# Patient Record
Sex: Female | Born: 1950
Health system: Southern US, Community
[De-identification: ages and names within clinical notes are randomized; demographics above are authoritative.]

## PROBLEM LIST (undated history)

## (undated) DIAGNOSIS — K219 Gastro-esophageal reflux disease without esophagitis: Secondary | ICD-10-CM

## (undated) DIAGNOSIS — I10 Essential (primary) hypertension: Secondary | ICD-10-CM

## (undated) DIAGNOSIS — E782 Mixed hyperlipidemia: Secondary | ICD-10-CM

## (undated) HISTORY — DX: Essential (primary) hypertension: I10

## (undated) HISTORY — DX: Gastro-esophageal reflux disease without esophagitis: K21.9

## (undated) HISTORY — PX: ABDOMINAL HYSTERECTOMY: SHX81

## (undated) HISTORY — DX: Mixed hyperlipidemia: E78.2

---

## 1997-12-11 ENCOUNTER — Other Ambulatory Visit: Admission: RE | Admit: 1997-12-11 | Discharge: 1997-12-11 | Payer: Self-pay | Admitting: *Deleted

## 1999-01-08 ENCOUNTER — Other Ambulatory Visit: Admission: RE | Admit: 1999-01-08 | Discharge: 1999-01-08 | Payer: Self-pay | Admitting: *Deleted

## 2000-02-11 ENCOUNTER — Other Ambulatory Visit: Admission: RE | Admit: 2000-02-11 | Discharge: 2000-02-11 | Payer: Self-pay | Admitting: *Deleted

## 2001-02-24 ENCOUNTER — Other Ambulatory Visit: Admission: RE | Admit: 2001-02-24 | Discharge: 2001-02-24 | Payer: Self-pay | Admitting: *Deleted

## 2002-02-27 ENCOUNTER — Other Ambulatory Visit: Admission: RE | Admit: 2002-02-27 | Discharge: 2002-02-27 | Payer: Self-pay | Admitting: *Deleted

## 2003-03-08 ENCOUNTER — Other Ambulatory Visit: Admission: RE | Admit: 2003-03-08 | Discharge: 2003-03-08 | Payer: Self-pay | Admitting: *Deleted

## 2004-04-03 ENCOUNTER — Other Ambulatory Visit: Admission: RE | Admit: 2004-04-03 | Discharge: 2004-04-03 | Payer: Self-pay | Admitting: *Deleted

## 2005-03-23 ENCOUNTER — Other Ambulatory Visit: Admission: RE | Admit: 2005-03-23 | Discharge: 2005-03-23 | Payer: Self-pay | Admitting: *Deleted

## 2006-06-02 ENCOUNTER — Other Ambulatory Visit: Admission: RE | Admit: 2006-06-02 | Discharge: 2006-06-02 | Payer: Self-pay | Admitting: *Deleted

## 2007-06-09 ENCOUNTER — Other Ambulatory Visit: Admission: RE | Admit: 2007-06-09 | Discharge: 2007-06-09 | Payer: Self-pay | Admitting: *Deleted

## 2013-03-15 ENCOUNTER — Other Ambulatory Visit: Payer: Self-pay | Admitting: Obstetrics and Gynecology

## 2016-12-04 LAB — HM COLONOSCOPY

## 2017-08-19 ENCOUNTER — Ambulatory Visit: Payer: BC Managed Care – PPO | Admitting: Sports Medicine

## 2017-08-19 ENCOUNTER — Encounter: Payer: Self-pay | Admitting: Sports Medicine

## 2017-08-19 VITALS — BP 120/70 | Ht 64.0 in | Wt 142.0 lb

## 2017-08-19 DIAGNOSIS — M19072 Primary osteoarthritis, left ankle and foot: Secondary | ICD-10-CM | POA: Diagnosis not present

## 2017-08-20 ENCOUNTER — Encounter: Payer: Self-pay | Admitting: Sports Medicine

## 2017-08-20 NOTE — Progress Notes (Signed)
   Subjective:    Patient ID: Melinda Miller, female    DOB: July 02, 1950, 67 y.o.   MRN: [id number]  HPI chief complaint: Left foot pain  Very pleasant 67 year old female is here today at the request of Dr. Madelon Lipsaffrey for possible orthotics. She saw Dr.Caffrey's PA on April 2 and was diagnosed with some mild arthritis in the mid foot. She was given a prescription for meloxicam to take as needed and referred to our office for possible inserts. She localizes the pain to the arch of the foot. It does radiate dorsally as well.She has not noticed any swelling. Her symptoms have been present for a couple of months. No trauma. She denies numbness or tingling. [facility] mild midfoot DJD. Patient has not had orthotics in the past. She denies pain in the right foot.  Past medical history reviewed Medications reviewed Allergies reviewed    Review of Systems    as above Objective:   Physical Exam  Well-developed, well-nourished. No acute distress.Awake alert and oriented 3. Vital signs reviewed  Left foot: Slight cavus foot. No soft tissue swelling. Slight tenderness to palpation along the arch of the foot but no tenderness to palpation at the calcaneal origin of the plantar fascia. No pain with calcaneal squeeze.Slight tenderness to palpation across the mid foot dorsal area. Good pulses. Walking without a limp.      Assessment & Plan:   Left foot pain likely secondary to mild midfoot DJD  Custom orthotics were created for the patient today. She found them to be comfortable prior to leaving the office. Total of 30 minutes was spent with the patient with greater than 50% of the time spent in face-to-face consultation discussing orthotic construction, instruction, and fitting. Follow-up as needed.  Patient was fitted for a : standard, cushioned, semi-rigid orthotic. The orthotic was heated and afterward the patient stood on the orthotic blank  positioned on the orthotic stand. The patient was positioned in subtalar neutral position and 10 degrees of ankle dorsiflexion in a weight bearing stance. After completion of molding, a stable base was applied to the orthotic blank. The blank was ground to a stable position for weight bearing. Size: 7 Base: Blue EVA Posting: none Additional orthotic padding: none

## 2019-06-10 ENCOUNTER — Ambulatory Visit: Payer: Medicare PPO | Attending: Internal Medicine

## 2019-06-10 DIAGNOSIS — Z23 Encounter for immunization: Secondary | ICD-10-CM | POA: Insufficient documentation

## 2019-06-10 NOTE — Progress Notes (Signed)
   Covid-19 Vaccination Clinic  Name:  QUINN BARTLING    MRN: [id number] DOB: Nov 18, 1950  06/10/2019  Ms. Claire was observed post Covid-19 immunization for 15 minutes without incidence. She was provided with Vaccine Information Sheet and instruction to access the V-Safe system.   Ms. Taira was instructed to call 911 with any severe reactions post vaccine: Marland Kitchen Difficulty breathing  . Swelling of your face and throat  . A fast heartbeat  . A bad rash all over your body  . Dizziness and weakness    Immunizations Administered    Name Date Dose VIS Date Route   Pfizer COVID-19 Vaccine 06/10/2019 12:22 PM 0.3 mL 04/28/2019 Intramuscular   Manufacturer: [employer], [employer]   Lot: [id number]   NDC: [address]-9

## 2019-06-16 DIAGNOSIS — R197 Diarrhea, unspecified: Secondary | ICD-10-CM | POA: Diagnosis not present

## 2019-06-16 DIAGNOSIS — U071 COVID-19: Secondary | ICD-10-CM | POA: Diagnosis not present

## 2019-06-16 DIAGNOSIS — J301 Allergic rhinitis due to pollen: Secondary | ICD-10-CM | POA: Diagnosis not present

## 2019-06-16 DIAGNOSIS — Z20828 Contact with and (suspected) exposure to other viral communicable diseases: Secondary | ICD-10-CM | POA: Diagnosis not present

## 2019-06-16 DIAGNOSIS — Z23 Encounter for immunization: Secondary | ICD-10-CM | POA: Diagnosis not present

## 2019-06-22 DIAGNOSIS — I10 Essential (primary) hypertension: Secondary | ICD-10-CM | POA: Diagnosis not present

## 2019-06-22 DIAGNOSIS — U071 COVID-19: Secondary | ICD-10-CM | POA: Diagnosis not present

## 2019-06-22 DIAGNOSIS — R031 Nonspecific low blood-pressure reading: Secondary | ICD-10-CM | POA: Diagnosis not present

## 2019-06-22 DIAGNOSIS — E785 Hyperlipidemia, unspecified: Secondary | ICD-10-CM | POA: Diagnosis not present

## 2019-06-22 DIAGNOSIS — R002 Palpitations: Secondary | ICD-10-CM | POA: Diagnosis not present

## 2019-06-22 DIAGNOSIS — I493 Ventricular premature depolarization: Secondary | ICD-10-CM | POA: Diagnosis not present

## 2019-07-01 ENCOUNTER — Ambulatory Visit: Payer: Medicare PPO | Attending: Internal Medicine

## 2019-07-01 DIAGNOSIS — Z23 Encounter for immunization: Secondary | ICD-10-CM | POA: Insufficient documentation

## 2019-07-01 NOTE — Progress Notes (Signed)
   Covid-19 Vaccination Clinic  Name:  Melinda Miller    MRN: [id number] DOB: 05/10/1951  07/01/2019  [name] was observed post Covid-19 immunization for 15 minutes without incidence. She was provided with Vaccine Information Sheet and instruction to access the V-Safe system.   [name] was instructed to call 911 with any severe reactions post vaccine: Marland Kitchen Difficulty breathing  . Swelling of your face and throat  . A fast heartbeat  . A bad rash all over your body  . Dizziness and weakness    Immunizations Administered    Name Date Dose VIS Date Route   Pfizer COVID-19 Vaccine 07/01/2019 10:59 AM 0.3 mL 04/28/2019 Intramuscular   Manufacturer: [employer], [employer]   Lot: [id number]   NDC: [id number]

## 2019-08-10 DIAGNOSIS — Z1231 Encounter for screening mammogram for malignant neoplasm of breast: Secondary | ICD-10-CM | POA: Diagnosis not present

## 2019-08-10 LAB — HM MAMMOGRAPHY: HM Mammogram: NORMAL (ref 0–4)

## 2019-10-18 NOTE — Progress Notes (Signed)
New Patient Office Visit  Subjective:  Patient ID: Melinda Miller, female    DOB: 1951/04/27  Age: 69 y.o. MRN: [id number]  CC:  Chief Complaint  Patient presents with  . Establish Care    Will schedule appt with GYN for CPE and will have her set up appt for DEXA. Patient has not had any Pneumo vaccines.  . Hypertension    Feels that her current b/p medication may not be working as well.   . Hyperlipidemia  . Gastroesophageal Reflux    Complains she still has belching    HPI Melinda Miller presents to establish care.  Patient was referred by Hassan Rowan and Vivien Rossetti and Lenore Cordia, other patients of mine.  Patient previously saw Dr. Nicki Reaper who has retired and she was placed with a new physician at [facility].  Patient has hypertension, hyperlipidemia, acid reflux and some arthritis.  Patient has no complaints today.  Hypertension: This was diagnosed more than 10 years ago.  Patient is on telmisartan 40 mg once daily.  Patient eats healthy and exercises.  Hyperlipidemia: This was diagnosed more than 10 years ago.  Patient is on Crestor 5 mg once daily.  Patient eats healthy and exercises.  GERD: Patient's reflux disease is currently being treated with omeprazole. Pt has breakthrough reflux.   Osteoarthritis of hands: Currently on meloxicam 15 mg once daily which does help.  Allergic rhinitis: Patient is taking Nasonex.         Patient has psoriasis on the plantar surface of her feet.  She uses Diprolene for this which works well.                 Past Medical History:  Diagnosis Date  . Essential hypertension   . GERD (gastroesophageal reflux disease)   . Mixed hyperlipidemia     Past Surgical History:  Procedure Laterality Date  . ABDOMINAL HYSTERECTOMY      Family History  Problem Relation Age of Onset  . Alzheimer's disease Mother   . CAD Father   . Heart attack Father   . Cancer Sister        kidney  . Alzheimer's disease Sister     Social  History   Socioeconomic History  . Marital status: Married    Spouse name: Ahrianna Siglin  . Number of children: 2  . Years of education: Not on file  . Highest education level: Not on file  Occupational History  . Not on file  Tobacco Use  . Smoking status: Never Smoker  . Smokeless tobacco: Never Used  Substance and Sexual Activity  . Alcohol use: Yes    Alcohol/week: 1.0 standard drink    Types: 1 Cans of beer per week  . Drug use: Never  . Sexual activity: Not on file  Other Topics Concern  . Not on file  Social History Narrative  . Not on file   Social Determinants of Health   Financial Resource Strain:   . Difficulty of Paying Living Expenses:   Food Insecurity:   . Worried About Charity fundraiser in the Last Year:   . Arboriculturist in the Last Year:   Transportation Needs:   . Film/video editor (Medical):   Marland Kitchen Lack of Transportation (Non-Medical):   Physical Activity:   . Days of Exercise per Week:   . Minutes of Exercise per Session:   Stress:   . Feeling of Stress :   Social  Connections:   . Frequency of Communication with Friends and Family:   . Frequency of Social Gatherings with Friends and Family:   . Attends Religious Services:   . Active Member of Clubs or Organizations:   . Attends Banker Meetings:   Marland Kitchen Marital Status:   Intimate Partner Violence:   . Fear of Current or Ex-Partner:   . Emotionally Abused:   Marland Kitchen Physically Abused:   . Sexually Abused:     ROS Review of Systems  Constitutional: Negative for chills, fatigue and fever.  HENT: Positive for hearing loss (has hearing aids). Negative for congestion, ear pain, rhinorrhea and sore throat.   Respiratory: Negative for cough and shortness of breath.   Cardiovascular: Negative for chest pain.  Gastrointestinal: Negative for abdominal pain, constipation, diarrhea, nausea and vomiting.  Genitourinary: Negative for dysuria and urgency.  Musculoskeletal: Negative for  arthralgias, back pain and myalgias.  Neurological: Negative for dizziness, weakness, light-headedness and headaches.  Psychiatric/Behavioral: Negative for dysphoric mood. The patient is not nervous/anxious.     Objective:   Today's Vitals: BP 136/64   Pulse 82   Temp (!) 97.3 F (36.3 C)   Ht 5\' 4"  (1.626 m)   Wt 148 lb 12.8 oz (67.5 kg)   SpO2 92%   BMI 25.54 kg/m   Physical Exam Vitals reviewed.  Constitutional:      Appearance: Normal appearance. She is normal weight.  Cardiovascular:     Rate and Rhythm: Normal rate and regular rhythm.     Pulses: Normal pulses.     Heart sounds: Normal heart sounds.  Pulmonary:     Effort: Pulmonary effort is normal. No respiratory distress.     Breath sounds: Normal breath sounds.  Abdominal:     General: Abdomen is flat. Bowel sounds are normal.     Palpations: Abdomen is soft.     Tenderness: There is no abdominal tenderness.  Musculoskeletal:        General: Deformity (heberdens and bouchards nodules, but also some deviation of fingers. Question whether some of nodules might be uric acid tophi.) present.  Neurological:     Mental Status: She is alert and oriented to person, place, and time.  Psychiatric:        Mood and Affect: Mood normal.        Behavior: Behavior normal.     Assessment & Plan:   Problem List Items Addressed This Visit      Cardiovascular and Mediastinum   Essential hypertension, benign - Primary Well controlled.  Change telmasartan to 40mg  once at bedtime instead of am dosing. Continue to work on eating a healthy diet and exercise.  Labs drawn today.    Relevant Medications      telmisartan (MICARDIS) 40 MG tablet   Other Relevant Orders   CBC with Differential/Platelet (Completed)   Comprehensive metabolic panel (Completed)   TSH (Completed)     Respiratory   Non-seasonal allergic rhinitis due to pollen - continue nasonex.     Digestive   GERD without esophagitis Change prilosec to  protonix.   Relevant Medications   pantoprazole (PROTONIX) 40 MG tablet     Other   Mixed hyperlipidemia Well controlled.  No changes to medicines.  Continue to work on eating a healthy diet and exercise.  Labs drawn today.    Relevant Medications   rosuvastatin (CRESTOR) 5 MG tablet      Other Relevant Orders   Lipid panel (Completed)   Pain in  both hands  Continue meloxicam. Check uric acid level.   Relevant Orders   Uric Acid (Completed)   Need for prophylactic vaccination with Streptococcus pneumoniae (Pneumococcus) and Influenza vaccines   Relevant Orders   Pneumococcal polysaccharide vaccine 23-valent greater than or equal to 2yo subcutaneous/IM (Completed)      Outpatient Encounter Medications as of 10/19/2019  Medication Sig  . augmented betamethasone dipropionate (DIPROLENE-AF) 0.05 % cream Apply topically 2 (two) times daily.  . Calcium Carbonate-Vit D-Min (CALCIUM 1200 PO) Take 1,200 mg by mouth in the morning and at bedtime.  . meloxicam (MOBIC) 15 MG tablet Take 1 tablet (15 mg total) by mouth daily.  . mometasone (NASONEX) 50 MCG/ACT nasal spray Place 2 sprays into the nose daily.  . rosuvastatin (CRESTOR) 5 MG tablet Take 5 mg by mouth daily.  Marland Kitchen telmisartan (MICARDIS) 40 MG tablet Take 1 tablet (40 mg total) by mouth daily.  . [DISCONTINUED] augmented betamethasone dipropionate (DIPROLENE-AF) 0.05 % cream   . [DISCONTINUED] meloxicam (MOBIC) 15 MG tablet Take 15 mg by mouth daily.  . [DISCONTINUED] mometasone (NASONEX) 50 MCG/ACT nasal spray   . [DISCONTINUED] omeprazole (PRILOSEC) 20 MG capsule Take 20 mg by mouth at bedtime.  . [DISCONTINUED] telmisartan (MICARDIS) 40 MG tablet Take 40 mg by mouth daily.  . pantoprazole (PROTONIX) 40 MG tablet Take 1 tablet (40 mg total) by mouth daily.   No facility-administered encounter medications on file as of 10/19/2019.    Follow-up: Return in about 3 months (around 01/23/2020) for Annual Wellness Visit with Creola Corn,  LPN.   Blane Ohara, MD

## 2019-10-19 ENCOUNTER — Encounter: Payer: Self-pay | Admitting: Family Medicine

## 2019-10-19 ENCOUNTER — Other Ambulatory Visit: Payer: Self-pay

## 2019-10-19 ENCOUNTER — Ambulatory Visit: Payer: Medicare PPO | Admitting: Family Medicine

## 2019-10-19 VITALS — BP 136/64 | HR 82 | Temp 97.3°F | Ht 64.0 in | Wt 148.8 lb

## 2019-10-19 DIAGNOSIS — I10 Essential (primary) hypertension: Secondary | ICD-10-CM | POA: Diagnosis not present

## 2019-10-19 DIAGNOSIS — M79641 Pain in right hand: Secondary | ICD-10-CM | POA: Insufficient documentation

## 2019-10-19 DIAGNOSIS — E782 Mixed hyperlipidemia: Secondary | ICD-10-CM

## 2019-10-19 DIAGNOSIS — K219 Gastro-esophageal reflux disease without esophagitis: Secondary | ICD-10-CM | POA: Diagnosis not present

## 2019-10-19 DIAGNOSIS — J301 Allergic rhinitis due to pollen: Secondary | ICD-10-CM

## 2019-10-19 DIAGNOSIS — Z23 Encounter for immunization: Secondary | ICD-10-CM | POA: Insufficient documentation

## 2019-10-19 DIAGNOSIS — M79642 Pain in left hand: Secondary | ICD-10-CM | POA: Diagnosis not present

## 2019-10-19 MED ORDER — PANTOPRAZOLE SODIUM 40 MG PO TBEC: 40.0000 mg | DELAYED_RELEASE_TABLET | Freq: Every day | ORAL | 3 refills | Status: DC

## 2019-10-19 MED ORDER — MOMETASONE FUROATE 50 MCG/ACT NA SUSP: 2.0000 | Freq: Every day | NASAL | 5 refills | Status: DC

## 2019-10-19 MED ORDER — MELOXICAM 15 MG PO TABS: 15.0000 mg | ORAL_TABLET | Freq: Every day | ORAL | 3 refills | Status: DC

## 2019-10-19 MED ORDER — TELMISARTAN 40 MG PO TABS: 40.0000 mg | ORAL_TABLET | Freq: Every day | ORAL | 3 refills | Status: DC

## 2019-10-19 MED ORDER — BETAMETHASONE DIPROPIONATE AUG 0.05 % EX CREA: TOPICAL_CREAM | Freq: Two times a day (BID) | CUTANEOUS | 1 refills | Status: DC

## 2019-10-19 NOTE — Patient Instructions (Addendum)
Change telmasartan to 40 mg before bedtime.  Change prilosec to protonix (pantoprazole) 40 mg once daily.  Recommend Annual Wellness Exam with Creola Corn, LPN in our office in the fall.

## 2019-10-20 LAB — CBC WITH DIFFERENTIAL/PLATELET
Basophils Absolute: 0 10*3/uL (ref 0.0–0.2)
Basos: 1 %
EOS (ABSOLUTE): 0.1 10*3/uL (ref 0.0–0.4)
Eos: 1 %
Hematocrit: 39.6 % (ref 34.0–46.6)
Hemoglobin: 13.2 g/dL (ref 11.1–15.9)
Immature Grans (Abs): 0 10*3/uL (ref 0.0–0.1)
Immature Granulocytes: 0 %
Lymphocytes Absolute: 3.3 10*3/uL — ABNORMAL HIGH (ref 0.7–3.1)
Lymphs: 41 %
MCH: 31.4 pg (ref 26.6–33.0)
MCHC: 33.3 g/dL (ref 31.5–35.7)
MCV: 94 fL (ref 79–97)
Monocytes Absolute: 0.9 10*3/uL (ref 0.1–0.9)
Monocytes: 11 %
Neutrophils Absolute: 3.6 10*3/uL (ref 1.4–7.0)
Neutrophils: 46 %
Platelets: 219 10*3/uL (ref 150–450)
RBC: 4.21 x10E6/uL (ref 3.77–5.28)
RDW: 12.9 % (ref 11.7–15.4)
WBC: 7.9 10*3/uL (ref 3.4–10.8)

## 2019-10-20 LAB — LIPID PANEL
Chol/HDL Ratio: 3.2 ratio (ref 0.0–4.4)
Cholesterol, Total: 187 mg/dL (ref 100–199)
HDL: 59 mg/dL (ref >39)
LDL Chol Calc (NIH): 111 mg/dL — ABNORMAL HIGH (ref 0–99)
Triglycerides: 91 mg/dL (ref 0–149)
VLDL Cholesterol Cal: 17 mg/dL (ref 5–40)

## 2019-10-20 LAB — CARDIOVASCULAR RISK ASSESSMENT

## 2019-10-20 LAB — COMPREHENSIVE METABOLIC PANEL
ALT: 15 IU/L (ref 0–32)
AST: 19 IU/L (ref 0–40)
Albumin/Globulin Ratio: 1.7 (ref 1.2–2.2)
Albumin: 4.7 g/dL (ref 3.8–4.8)
Alkaline Phosphatase: 96 IU/L (ref 48–121)
BUN/Creatinine Ratio: 22 (ref 12–28)
BUN: 20 mg/dL (ref 8–27)
Bilirubin Total: 0.5 mg/dL (ref 0.0–1.2)
CO2: 25 mmol/L (ref 20–29)
Calcium: 10 mg/dL (ref 8.7–10.3)
Chloride: 100 mmol/L (ref 96–106)
Creatinine, Ser: 0.89 mg/dL (ref 0.57–1.00)
GFR calc Af Amer: 78 mL/min/{1.73_m2} (ref >59)
GFR calc non Af Amer: 68 mL/min/{1.73_m2} (ref >59)
Globulin, Total: 2.7 g/dL (ref 1.5–4.5)
Glucose: 91 mg/dL (ref 65–99)
Potassium: 5.1 mmol/L (ref 3.5–5.2)
Sodium: 139 mmol/L (ref 134–144)
Total Protein: 7.4 g/dL (ref 6.0–8.5)

## 2019-10-20 LAB — TSH: TSH: 1.99 u[IU]/mL (ref 0.450–4.500)

## 2019-10-20 LAB — URIC ACID: Uric Acid: 6.7 mg/dL (ref 3.0–7.2)

## 2019-10-28 ENCOUNTER — Encounter: Payer: Self-pay | Admitting: Family Medicine

## 2019-10-28 DIAGNOSIS — J301 Allergic rhinitis due to pollen: Secondary | ICD-10-CM | POA: Insufficient documentation

## 2019-11-01 ENCOUNTER — Other Ambulatory Visit: Payer: Self-pay

## 2019-11-01 MED ORDER — ROSUVASTATIN CALCIUM 5 MG PO TABS: 5.0000 mg | ORAL_TABLET | Freq: Every day | ORAL | 0 refills | Status: DC

## 2020-01-23 ENCOUNTER — Ambulatory Visit: Payer: Medicare PPO

## 2020-02-02 ENCOUNTER — Other Ambulatory Visit: Payer: Self-pay | Admitting: Family Medicine

## 2020-05-03 ENCOUNTER — Other Ambulatory Visit: Payer: Self-pay | Admitting: Family Medicine

## 2020-08-08 ENCOUNTER — Other Ambulatory Visit: Payer: Self-pay | Admitting: Family Medicine

## 2020-08-13 ENCOUNTER — Other Ambulatory Visit: Payer: Self-pay

## 2020-08-13 MED ORDER — ROSUVASTATIN CALCIUM 5 MG PO TABS: ORAL_TABLET | ORAL | 0 refills | Status: DC

## 2020-08-16 ENCOUNTER — Ambulatory Visit: Payer: Medicare PPO | Admitting: Family Medicine

## 2020-08-16 ENCOUNTER — Encounter: Payer: Self-pay | Admitting: Family Medicine

## 2020-08-16 ENCOUNTER — Other Ambulatory Visit: Payer: Self-pay

## 2020-08-16 VITALS — BP 132/84 | HR 76 | Temp 96.5°F | Ht 64.0 in | Wt 153.0 lb

## 2020-08-16 DIAGNOSIS — N3000 Acute cystitis without hematuria: Secondary | ICD-10-CM | POA: Diagnosis not present

## 2020-08-16 DIAGNOSIS — B373 Candidiasis of vulva and vagina: Secondary | ICD-10-CM | POA: Diagnosis not present

## 2020-08-16 DIAGNOSIS — R6889 Other general symptoms and signs: Secondary | ICD-10-CM | POA: Insufficient documentation

## 2020-08-16 DIAGNOSIS — R002 Palpitations: Secondary | ICD-10-CM | POA: Insufficient documentation

## 2020-08-16 DIAGNOSIS — I10 Essential (primary) hypertension: Secondary | ICD-10-CM | POA: Insufficient documentation

## 2020-08-16 DIAGNOSIS — U071 COVID-19: Secondary | ICD-10-CM | POA: Insufficient documentation

## 2020-08-16 DIAGNOSIS — B3731 Acute candidiasis of vulva and vagina: Secondary | ICD-10-CM

## 2020-08-16 DIAGNOSIS — I493 Ventricular premature depolarization: Secondary | ICD-10-CM | POA: Insufficient documentation

## 2020-08-16 LAB — POCT URINALYSIS DIPSTICK
Bilirubin, UA: NEGATIVE
Blood, UA: NEGATIVE
Glucose, UA: NEGATIVE
Ketones, UA: NEGATIVE
Protein, UA: NEGATIVE
Spec Grav, UA: 1.02 (ref 1.010–1.025)
Urobilinogen, UA: 0.2 E.U./dL
pH, UA: 5.5 (ref 5.0–8.0)

## 2020-08-16 MED ORDER — NITROFURANTOIN MONOHYD MACRO 100 MG PO CAPS: 100.0000 mg | ORAL_CAPSULE | Freq: Two times a day (BID) | ORAL | 0 refills | Status: DC

## 2020-08-16 MED ORDER — FLUCONAZOLE 150 MG PO TABS: 150.0000 mg | ORAL_TABLET | Freq: Once | ORAL | 0 refills | Status: AC

## 2020-08-16 NOTE — Progress Notes (Signed)
Subjective:  Patient ID: Melinda Miller, female    DOB: [birth year]  Age: 70 y.o. MRN: [id number]  Chief Complaint  Patient presents with  . Vaginal Itching  . Vaginal Pain    HPI Pt is concerned she has a yeast infection. Tried otc monistat x 3 days which did not help.  Itchy. Burns. No polyuria.   Current Outpatient Medications on File Prior to Visit  Medication Sig Dispense Refill  . augmented betamethasone dipropionate (DIPROLENE-AF) 0.05 % cream Apply topically 2 (two) times daily. 50 g 1  . Calcium Carbonate-Vit D-Min (CALCIUM 1200 PO) Take 1,200 mg by mouth in the morning and at bedtime.    . meloxicam (MOBIC) 15 MG tablet Take 1 tablet (15 mg total) by mouth daily. 90 tablet 3  . mometasone (NASONEX) 50 MCG/ACT nasal spray Place 2 sprays into the nose daily. 17 g 5  . pantoprazole (PROTONIX) 40 MG tablet Take 1 tablet (40 mg total) by mouth daily. 90 tablet 3  . rosuvastatin (CRESTOR) 5 MG tablet TAKE ONE (1) TABLET ONCE DAILY 90 tablet 0  . telmisartan (MICARDIS) 40 MG tablet Take 1 tablet (40 mg total) by mouth daily. 90 tablet 3   No current facility-administered medications on file prior to visit.   Past Medical History:  Diagnosis Date  . Essential hypertension   . GERD (gastroesophageal reflux disease)   . Mixed hyperlipidemia    Past Surgical History:  Procedure Laterality Date  . ABDOMINAL HYSTERECTOMY      Family History  Problem Relation Age of Onset  . Alzheimer's disease Mother   . CAD Father   . Heart attack Father   . Cancer Sister        kidney  . Alzheimer's disease Sister    Social History   Socioeconomic History  . Marital status: Married    Spouse name: Royal Beirne  . Number of children: 2  . Years of education: Not on file  . Highest education level: Not on file  Occupational History  . Not on file  Tobacco Use  . Smoking status: Never Smoker  . Smokeless tobacco: Never Used  Substance and Sexual Activity  . Alcohol use: Yes     Alcohol/week: 1.0 standard drink    Types: 1 Cans of beer per week  . Drug use: Never  . Sexual activity: Not on file  Other Topics Concern  . Not on file  Social History Narrative  . Not on file   Social Determinants of Health   Financial Resource Strain: Not on file  Food Insecurity: Not on file  Transportation Needs: Not on file  Physical Activity: Not on file  Stress: Not on file  Social Connections: Not on file    Review of Systems  Constitutional: Negative for chills, fatigue and fever.  HENT: Negative for congestion, ear pain and sore throat.   Respiratory: Negative for cough and shortness of breath.   Cardiovascular: Negative for chest pain.  Gastrointestinal: Negative for abdominal pain, nausea and vomiting.  Genitourinary: Positive for dysuria and vaginal discharge. Negative for hematuria and urgency.       Vaginal itching     Objective:  BP 132/84 (BP Location: Right Arm, Patient Position: Sitting, Cuff Size: Normal)   Pulse 76   Temp (!) 96.5 F (35.8 C) (Temporal)   Ht 5\' 4"  (1.626 m)   Wt 153 lb (69.4 kg)   SpO2 99%   BMI 26.26 kg/m   BP/Weight 08/16/2020 10/19/2019  08/19/2017  Systolic BP 132 136 120  Diastolic BP 84 64 70  Wt. (Lbs) 153 148.8 142  BMI 26.26 25.54 24.37    Physical Exam Vitals reviewed.  Constitutional:      Appearance: Normal appearance. She is normal weight.  Cardiovascular:     Rate and Rhythm: Normal rate and regular rhythm.     Heart sounds: Normal heart sounds.  Pulmonary:     Effort: Pulmonary effort is normal. No respiratory distress.     Breath sounds: Normal breath sounds.  Abdominal:     General: Abdomen is flat. Bowel sounds are normal.     Palpations: Abdomen is soft.     Tenderness: There is no abdominal tenderness. There is no right CVA tenderness or left CVA tenderness.  Neurological:     Mental Status: She is alert and oriented to person, place, and time.  Psychiatric:        Mood and Affect: Mood normal.         Behavior: Behavior normal.     Diabetic Foot Exam - Simple   No data filed      Lab Results  Component Value Date   WBC 7.9 10/19/2019   HGB 13.2 10/19/2019   HCT 39.6 10/19/2019   PLT 219 10/19/2019   GLUCOSE 91 10/19/2019   CHOL 187 10/19/2019   TRIG 91 10/19/2019   HDL 59 10/19/2019   LDLCALC 111 (H) 10/19/2019   ALT 15 10/19/2019   AST 19 10/19/2019   NA 139 10/19/2019   K 5.1 10/19/2019   CL 100 10/19/2019   CREATININE 0.89 10/19/2019   BUN 20 10/19/2019   CO2 25 10/19/2019   TSH 1.990 10/19/2019      Assessment & Plan:   1. Acute cystitis without hematuria - POCT urinalysis dipstick - nitrofurantoin, macrocrystal-monohydrate, (MACROBID) 100 MG capsule; Take 1 capsule (100 mg total) by mouth 2 (two) times daily.  Dispense: 14 capsule; Refill: 0 - Urine Culture  2. Candidal vaginitis - fluconazole (DIFLUCAN) 150 MG tablet; Take 1 tablet (150 mg total) by mouth once for 1 dose.  Dispense: 2 tablet; Refill: 0  Pt had to leave to pick up her granddaughter and I was running late.  Treat empirically for yeast infection. Return next week if not improving.   Meds ordered this encounter  Medications  . nitrofurantoin, macrocrystal-monohydrate, (MACROBID) 100 MG capsule    Sig: Take 1 capsule (100 mg total) by mouth 2 (two) times daily.    Dispense:  14 capsule    Refill:  0  . fluconazole (DIFLUCAN) 150 MG tablet    Sig: Take 1 tablet (150 mg total) by mouth once for 1 dose.    Dispense:  2 tablet    Refill:  0    Orders Placed This Encounter  Procedures  . Urine Culture  . POCT urinalysis dipstick        Follow-up: Return in about 3 months (around 11/13/2020).  An After Visit Summary was printed and given to the patient.  Blane Ohara, MD [facility] [phone]

## 2020-08-18 LAB — URINE CULTURE

## 2020-08-26 ENCOUNTER — Other Ambulatory Visit: Payer: Self-pay

## 2020-08-30 DIAGNOSIS — Z1231 Encounter for screening mammogram for malignant neoplasm of breast: Secondary | ICD-10-CM | POA: Diagnosis not present

## 2020-08-30 LAB — HM MAMMOGRAPHY

## 2020-09-10 ENCOUNTER — Other Ambulatory Visit: Payer: Self-pay

## 2020-09-10 ENCOUNTER — Ambulatory Visit: Payer: Medicare PPO | Admitting: Family Medicine

## 2020-09-10 VITALS — BP 140/70 | HR 80 | Temp 97.1°F | Resp 16 | Ht 64.0 in | Wt 151.0 lb

## 2020-09-10 DIAGNOSIS — S80862A Insect bite (nonvenomous), left lower leg, initial encounter: Secondary | ICD-10-CM

## 2020-09-10 DIAGNOSIS — S80861A Insect bite (nonvenomous), right lower leg, initial encounter: Secondary | ICD-10-CM | POA: Diagnosis not present

## 2020-09-10 DIAGNOSIS — S30861A Insect bite (nonvenomous) of abdominal wall, initial encounter: Secondary | ICD-10-CM

## 2020-09-10 DIAGNOSIS — W57XXXA Bitten or stung by nonvenomous insect and other nonvenomous arthropods, initial encounter: Secondary | ICD-10-CM | POA: Diagnosis not present

## 2020-09-10 DIAGNOSIS — S61231A Puncture wound without foreign body of left index finger without damage to nail, initial encounter: Secondary | ICD-10-CM

## 2020-09-10 MED ORDER — DOXYCYCLINE HYCLATE 100 MG PO TABS
100.0000 mg | ORAL_TABLET | Freq: Two times a day (BID) | ORAL | 0 refills | Status: DC
Start: 2020-09-10 — End: 2020-09-17

## 2020-09-10 NOTE — Progress Notes (Signed)
Acute Office Visit  Subjective:    Patient ID: Melinda Miller, female    DOB: 1950/10/15, 70 y.o.   MRN: [id number]  Chief Complaint  Patient presents with  . Insect Bite    HPI Patient is in today for insect bites.  [place] and had bite to her right abdomen.  She noticed it in the middle of the night Sunday night when it was itching.  Denies any pain.  They were fishing.  In addition she has noticed [place] that she has had small red marks on her leg that resemble bites.  They do not itch nor are they painful.  She denies fevers, chills, sweats.  Denies any significant headache.  She has a slight headache Sunday morning but resolved.  No other family members have any bites. Patient is clean the right abdominal insect bite with peroxide daily.  In addition she had a fishhook going to her left index finger on Saturday.  Denies pain or drainage from the site.  Last Tdap was 2014.  Past Medical History:  Diagnosis Date  . Essential hypertension   . GERD (gastroesophageal reflux disease)   . Mixed hyperlipidemia     Past Surgical History:  Procedure Laterality Date  . ABDOMINAL HYSTERECTOMY      Family History  Problem Relation Age of Onset  . Alzheimer's disease Mother   . CAD Father   . Heart attack Father   . Cancer Sister        kidney  . Alzheimer's disease Sister     Social History   Socioeconomic History  . Marital status: Married    Spouse name: Analy Bassford  . Number of children: 2  . Years of education: Not on file  . Highest education level: Not on file  Occupational History  . Not on file  Tobacco Use  . Smoking status: Never Smoker  . Smokeless tobacco: Never Used  Substance and Sexual Activity  . Alcohol use: Yes    Alcohol/week: 1.0 standard drink    Types: 1 Cans of beer per week  . Drug use: Never  . Sexual activity: Not on file  Other Topics Concern  . Not on file   Social History Narrative  . Not on file   Social Determinants of Health   Financial Resource Strain: Not on file  Food Insecurity: Not on file  Transportation Needs: Not on file  Physical Activity: Not on file  Stress: Not on file  Social Connections: Not on file  Intimate Partner Violence: Not on file    Outpatient Medications Prior to Visit  Medication Sig Dispense Refill  . augmented betamethasone dipropionate (DIPROLENE-AF) 0.05 % cream Apply topically 2 (two) times daily. 50 g 1  . Calcium Carbonate-Vit D-Min (CALCIUM 1200 PO) Take 1,200 mg by mouth in the morning and at bedtime.    . meloxicam (MOBIC) 15 MG tablet Take 1 tablet (15 mg total) by mouth daily. 90 tablet 3  . mometasone (NASONEX) 50 MCG/ACT nasal spray Place 2 sprays into the nose daily. 17 g 5  . pantoprazole (PROTONIX) 40 MG tablet Take 1 tablet (40 mg total) by mouth daily. 90 tablet 3  . rosuvastatin (CRESTOR) 5 MG tablet TAKE ONE (1) TABLET ONCE DAILY 90 tablet 0  . telmisartan (MICARDIS) 40 MG tablet Take 1 tablet (40 mg total) by mouth daily. 90 tablet 3  . nitrofurantoin,  macrocrystal-monohydrate, (MACROBID) 100 MG capsule Take 1 capsule (100 mg total) by mouth 2 (two) times daily. 14 capsule 0   No facility-administered medications prior to visit.    Allergies  Allergen Reactions  . Sulfa Antibiotics Hives    Review of Systems  Constitutional: Negative for chills, fatigue and fever.  Neurological: Negative for headaches.       Objective:    Physical Exam Vitals reviewed.  Constitutional:      Appearance: Normal appearance.  Skin:    Findings: Lesion (Insect bite right upper abdomen.  Has a erythema surrounding that.  Total lesion is approximately the size of a quarter.  Patient has small macules over her lower and upper legs approximately 15-20 total.  They are not grouped in patches.  She has no rashe) present.     Comments: Puncture wound of left index finger no drainage.  No erythema.   Nontender   Neurological:     Mental Status: She is alert.     BP 140/70   Pulse 80   Temp (!) 97.1 F (36.2 C)   Resp 16   Ht 5\' 4"  (1.626 m)   Wt 151 lb (68.5 kg)   BMI 25.92 kg/m  Wt Readings from Last 3 Encounters:  09/10/20 151 lb (68.5 kg)  08/16/20 153 lb (69.4 kg)  10/19/19 148 lb 12.8 oz (67.5 kg)    Health Maintenance Due  Topic Date Due  . Hepatitis C Screening  Never done  . TETANUS/TDAP  Never done  . COLONOSCOPY (Pts 45-89yrs Insurance coverage will need to be confirmed)  Never done  . DEXA SCAN  Never done  . COVID-19 Vaccine (3 - Booster for Pfizer series) 12/29/2019    There are no preventive care reminders to display for this patient.   Lab Results  Component Value Date   TSH 1.990 10/19/2019   Lab Results  Component Value Date   WBC 7.9 10/19/2019   HGB 13.2 10/19/2019   HCT 39.6 10/19/2019   MCV 94 10/19/2019   PLT 219 10/19/2019   Lab Results  Component Value Date   NA 139 10/19/2019   K 5.1 10/19/2019   CO2 25 10/19/2019   GLUCOSE 91 10/19/2019   BUN 20 10/19/2019   CREATININE 0.89 10/19/2019   BILITOT 0.5 10/19/2019   ALKPHOS 96 10/19/2019   AST 19 10/19/2019   ALT 15 10/19/2019   PROT 7.4 10/19/2019   ALBUMIN 4.7 10/19/2019   CALCIUM 10.0 10/19/2019   Lab Results  Component Value Date   CHOL 187 10/19/2019   Lab Results  Component Value Date   HDL 59 10/19/2019   Lab Results  Component Value Date   LDLCALC 111 (H) 10/19/2019   Lab Results  Component Value Date   TRIG 91 10/19/2019   Lab Results  Component Value Date   CHOLHDL 3.2 10/19/2019   No results found for: HGBA1C     Assessment & Plan:  1. Insect bite of abdominal wall, initial encounter Doxycycline 100 mg 2 daily x1 to prophylax against possible tick fever. 2. Insect bite of left lower extremity, initial encounter 3. Insect bite of right lower extremity, initial encounter 4. Puncture wound of left index finger   Reassurance given.  No obvious  source.  They have no pets.  She has not been in garden.  [place] the bites on her legs occurred after she returned.  Patient to call back if worsening. Meds ordered this  encounter  Medications  . doxycycline (VIBRA-TABS) 100 MG tablet    Sig: Take 1 tablet (100 mg total) by mouth 2 (two) times daily.    Dispense:  2 tablet    Refill:  0    Follow-up: No follow-ups on file.  An After Visit Summary was printed and given to the patient.  Blane Ohara, MD [facility] [phone]

## 2020-09-17 ENCOUNTER — Ambulatory Visit (INDEPENDENT_AMBULATORY_CARE_PROVIDER_SITE_OTHER): Payer: Medicare PPO

## 2020-09-17 ENCOUNTER — Other Ambulatory Visit: Payer: Self-pay

## 2020-09-17 VITALS — BP 132/70 | HR 68 | Temp 97.8°F | Resp 16 | Ht 64.0 in | Wt 152.0 lb

## 2020-09-17 DIAGNOSIS — K219 Gastro-esophageal reflux disease without esophagitis: Secondary | ICD-10-CM

## 2020-09-17 DIAGNOSIS — Z0000 Encounter for general adult medical examination without abnormal findings: Secondary | ICD-10-CM

## 2020-09-17 DIAGNOSIS — Z6826 Body mass index (BMI) 26.0-26.9, adult: Secondary | ICD-10-CM

## 2020-09-17 DIAGNOSIS — E782 Mixed hyperlipidemia: Secondary | ICD-10-CM

## 2020-09-17 MED ORDER — PANTOPRAZOLE SODIUM 40 MG PO TBEC: 40.0000 mg | DELAYED_RELEASE_TABLET | Freq: Every day | ORAL | 3 refills | Status: DC

## 2020-09-17 MED ORDER — ROSUVASTATIN CALCIUM 5 MG PO TABS: ORAL_TABLET | ORAL | 0 refills | Status: DC

## 2020-09-17 NOTE — Patient Instructions (Signed)
 Fall Prevention in the Home, Adult Falls can cause injuries and can happen to people of all ages. There are many things you can do to make your home safe and to help prevent falls. Ask for help when making these changes. What actions can I take to prevent falls? General Instructions  Use good lighting in all rooms. Replace any light bulbs that burn out.  Turn on the lights in dark areas. Use night-lights.  Keep items that you use often in easy-to-reach places. Lower the shelves around your home if needed.  Set up your furniture so you have a clear path. Avoid moving your furniture around.  Do not have throw rugs or other things on the floor that can make you trip.  Avoid walking on wet floors.  If any of your floors are uneven, fix them.  Add color or contrast paint or tape to clearly mark and help you see: ? Grab bars or handrails. ? First and last steps of staircases. ? Where the edge of each step is.  If you use a stepladder: ? Make sure that it is fully opened. Do not climb a closed stepladder. ? Make sure the sides of the stepladder are locked in place. ? Ask someone to hold the stepladder while you use it.  Know where your pets are when moving through your home. What can I do in the bathroom?  Keep the floor dry. Clean up any water on the floor right away.  Remove soap buildup in the tub or shower.  Use nonskid mats or decals on the floor of the tub or shower.  Attach bath mats securely with double-sided, nonslip rug tape.  If you need to sit down in the shower, use a plastic, nonslip stool.  Install grab bars by the toilet and in the tub and shower. Do not use towel bars as grab bars.      What can I do in the bedroom?  Make sure that you have a light by your bed that is easy to reach.  Do not use any sheets or blankets for your bed that hang to the floor.  Have a firm chair with side arms that you can use for support when you get dressed. What can I do  in the kitchen?  Clean up any spills right away.  If you need to reach something above you, use a step stool with a grab bar.  Keep electrical cords out of the way.  Do not use floor polish or wax that makes floors slippery. What can I do with my stairs?  Do not leave any items on the stairs.  Make sure that you have a light switch at the top and the bottom of the stairs.  Make sure that there are handrails on both sides of the stairs. Fix handrails that are broken or loose.  Install nonslip stair treads on all your stairs.  Avoid having throw rugs at the top or bottom of the stairs.  Choose a carpet that does not hide the edge of the steps on the stairs.  Check carpeting to make sure that it is firmly attached to the stairs. Fix carpet that is loose or worn. What can I do on the outside of my home?  Use bright outdoor lighting.  Fix the edges of walkways and driveways and fix any cracks.  Remove anything that might make you trip as you walk through a door, such as a raised step or threshold.  Trim   any bushes or trees on paths to your home.  Check to see if handrails are loose or broken and that both sides of all steps have handrails.  Install guardrails along the edges of any raised decks and porches.  Clear paths of anything that can make you trip, such as tools or rocks.  Have leaves, snow, or ice cleared regularly.  Use sand or salt on paths during winter.  Clean up any spills in your garage right away. This includes grease or oil spills. What other actions can I take?  Wear shoes that: ? Have a low heel. Do not wear high heels. ? Have rubber bottoms. ? Feel good on your feet and fit well. ? Are closed at the toe. Do not wear open-toe sandals.  Use tools that help you move around if needed. These include: ? Canes. ? Walkers. ? Scooters. ? Crutches.  Review your medicines with your doctor. Some medicines can make you feel dizzy. This can increase your  chance of falling. Ask your doctor what else you can do to help prevent falls. Where to find more information  Centers for Disease Control and Prevention, [employer]: www.cdc.gov  National Institute on Aging: www.nia.nih.gov Contact a doctor if:  You are afraid of falling at home.  You feel weak, drowsy, or dizzy at home.  You fall at home. Summary  There are many simple things that you can do to make your home safe and to help prevent falls.  Ways to make your home safe include removing things that can make you trip and installing grab bars in the bathroom.  Ask for help when making these changes in your home. This information is not intended to replace advice given to you by your health care provider. Make sure you discuss any questions you have with your health care provider. Document Revised: 12/06/2019 Document Reviewed: 12/06/2019 Elsevier Patient Education  2021 Elsevier Inc.   Health Maintenance, Female Adopting a healthy lifestyle and getting preventive care are important in promoting health and wellness. Ask your health care provider about:  The right schedule for you to have regular tests and exams.  Things you can do on your own to prevent diseases and keep yourself healthy. What should I know about diet, weight, and exercise? Eat a healthy diet  Eat a diet that includes plenty of vegetables, fruits, low-fat dairy products, and lean protein.  Do not eat a lot of foods that are high in solid fats, added sugars, or sodium.   Maintain a healthy weight Body mass index (BMI) is used to identify weight problems. It estimates body fat based on height and weight. Your health care provider can help determine your BMI and help you achieve or maintain a healthy weight. Get regular exercise Get regular exercise. This is one of the most important things you can do for your health. Most adults should:  Exercise for at least 150 minutes each week. The exercise should increase  your heart rate and make you sweat (moderate-intensity exercise).  Do strengthening exercises at least twice a week. This is in addition to the moderate-intensity exercise.  Spend less time sitting. Even light physical activity can be beneficial. Watch cholesterol and blood lipids Have your blood tested for lipids and cholesterol at 68 years of age, then have this test every 5 years. Have your cholesterol levels checked more often if:  Your lipid or cholesterol levels are high.  You are older than 68 years of age.  You are at   high risk for heart disease. What should I know about cancer screening? Depending on your health history and family history, you may need to have cancer screening at various ages. This may include screening for:  Breast cancer.  Cervical cancer.  Colorectal cancer.  Skin cancer.  Lung cancer. What should I know about heart disease, diabetes, and high blood pressure? Blood pressure and heart disease  High blood pressure causes heart disease and increases the risk of stroke. This is more likely to develop in people who have high blood pressure readings, are of African descent, or are overweight.  Have your blood pressure checked: ? Every 3-5 years if you are 18-39 years of age. ? Every year if you are 40 years old or older. Diabetes Have regular diabetes screenings. This checks your fasting blood sugar level. Have the screening done:  Once every three years after age 40 if you are at a normal weight and have a low risk for diabetes.  More often and at a younger age if you are overweight or have a high risk for diabetes. What should I know about preventing infection? Hepatitis B If you have a higher risk for hepatitis B, you should be screened for this virus. Talk with your health care provider to find out if you are at risk for hepatitis B infection. Hepatitis C Testing is recommended for:  Everyone born from 1945 through 1965.  Anyone with known  risk factors for hepatitis C. Sexually transmitted infections (STIs)  Get screened for STIs, including gonorrhea and chlamydia, if: ? You are sexually active and are younger than 68 years of age. ? You are older than 68 years of age and your health care provider tells you that you are at risk for this type of infection. ? Your sexual activity has changed since you were last screened, and you are at increased risk for chlamydia or gonorrhea. Ask your health care provider if you are at risk.  Ask your health care provider about whether you are at high risk for HIV. Your health care provider may recommend a prescription medicine to help prevent HIV infection. If you choose to take medicine to prevent HIV, you should first get tested for HIV. You should then be tested every 3 months for as long as you are taking the medicine. Pregnancy  If you are about to stop having your period (premenopausal) and you may become pregnant, seek counseling before you get pregnant.  Take 400 to 800 micrograms (mcg) of folic acid every day if you become pregnant.  Ask for birth control (contraception) if you want to prevent pregnancy. Osteoporosis and menopause Osteoporosis is a disease in which the bones lose minerals and strength with aging. This can result in bone fractures. If you are 65 years old or older, or if you are at risk for osteoporosis and fractures, ask your health care provider if you should:  Be screened for bone loss.  Take a calcium or vitamin D supplement to lower your risk of fractures.  Be given hormone replacement therapy (HRT) to treat symptoms of menopause. Follow these instructions at home: Lifestyle  Do not use any products that contain nicotine or tobacco, such as cigarettes, e-cigarettes, and chewing tobacco. If you need help quitting, ask your health care provider.  Do not use street drugs.  Do not share needles.  Ask your health care provider for help if you need support or  information about quitting drugs. Alcohol use  Do not drink alcohol   if: ? Your health care provider tells you not to drink. ? You are pregnant, may be pregnant, or are planning to become pregnant.  If you drink alcohol: ? Limit how much you use to 0-1 drink a day. ? Limit intake if you are breastfeeding.  Be aware of how much alcohol is in your drink. In the [place] one drink equals one 12 oz bottle of beer (355 mL), one 5 oz glass of wine (148 mL), or one 1 oz glass of hard liquor (44 mL). General instructions  Schedule regular health, dental, and eye exams.  Stay current with your vaccines.  Tell your health care provider if: ? You often feel depressed. ? You have ever been abused or do not feel safe at home. Summary  Adopting a healthy lifestyle and getting preventive care are important in promoting health and wellness.  Follow your health care provider's instructions about healthy diet, exercising, and getting tested or screened for diseases.  Follow your health care provider's instructions on monitoring your cholesterol and blood pressure. This information is not intended to replace advice given to you by your health care provider. Make sure you discuss any questions you have with your health care provider. Document Revised: 04/27/2018 Document Reviewed: 04/27/2018 Elsevier Patient Education  2021 Elsevier Inc.  

## 2020-09-17 NOTE — Progress Notes (Signed)
Subjective:   Melinda Miller is a 70 y.o. female who presents for Medicare Annual (Subsequent) preventive examination.  This wellness visit is conducted by a nurse.  The patient's medications were reviewed and reconciled since the patient's last visit.  History details were provided by the patient.  The history appears to be reliable.    Patient's last AWV was unknown.   Medical History: Patient history and Family history was reviewed  Medications, Allergies, and preventative health maintenance was reviewed and updated.   Review of Systems    Review of Systems  Constitutional: Negative.   HENT: Positive for hearing loss.   Eyes: Negative.   Respiratory: Negative for cough, chest tightness and shortness of breath.   Cardiovascular: Negative.  Negative for chest pain and palpitations.  Gastrointestinal: Negative.   Genitourinary: Negative.   Musculoskeletal: Negative.   Skin:       Insect bite on abdomen  Neurological: Negative.  Negative for dizziness, numbness and headaches.  Psychiatric/Behavioral: Negative.  Negative for confusion, dysphoric mood and suicidal ideas. The patient is not nervous/anxious.    Cardiac Risk Factors include: advanced age (>26men, >30 women);dyslipidemia;hypertension     Objective:    Today's Vitals   09/17/20 0854  BP: 132/70  Pulse: 68  Resp: 16  Temp: 97.8 F (36.6 C)  SpO2: 97%  Weight: 152 lb (68.9 kg)  Height: 5\' 4"  (1.626 m)  PainSc: 0-No pain   Body mass index is 26.09 kg/m.  Advanced Directives 09/17/2020  Does Patient Have a Medical Advance Directive? Yes  Type of Advance Directive Living will  Does patient want to make changes to medical advance directive? No - Patient declined    Current Medications (verified) Outpatient Encounter Medications as of 09/17/2020  Medication Sig  . augmented betamethasone dipropionate (DIPROLENE-AF) 0.05 % cream Apply topically 2 (two) times daily.  . Calcium Carbonate-Vit D-Min (CALCIUM 1200  PO) Take 1,200 mg by mouth in the morning and at bedtime.  . fluconazole (DIFLUCAN) 150 MG tablet Take by mouth.  . meloxicam (MOBIC) 15 MG tablet Take 1 tablet (15 mg total) by mouth daily.  . mometasone (NASONEX) 50 MCG/ACT nasal spray Place 2 sprays into the nose daily.  . pantoprazole (PROTONIX) 40 MG tablet Take 1 tablet (40 mg total) by mouth daily.  . rosuvastatin (CRESTOR) 5 MG tablet TAKE ONE (1) TABLET ONCE DAILY  . telmisartan (MICARDIS) 40 MG tablet Take 1 tablet (40 mg total) by mouth daily.  . [DISCONTINUED] doxycycline (VIBRA-TABS) 100 MG tablet Take 1 tablet (100 mg total) by mouth 2 (two) times daily.   No facility-administered encounter medications on file as of 09/17/2020.   Allergies (verified) Sulfa antibiotics   History: Past Medical History:  Diagnosis Date  . Essential hypertension   . GERD (gastroesophageal reflux disease)   . Mixed hyperlipidemia    Past Surgical History:  Procedure Laterality Date  . ABDOMINAL HYSTERECTOMY     Family History  Problem Relation Age of Onset  . Alzheimer's disease Mother   . CAD Father   . Heart attack Father   . Cancer Sister        kidney  . Alzheimer's disease Sister    Social History   Socioeconomic History  . Marital status: Married    Spouse name: Herberta Pickron  . Number of children: 2  . Years of education: Not on file  . Highest education level: Not on file  Occupational History  . Not on file  Tobacco  Use  . Smoking status: Never Smoker  . Smokeless tobacco: Never Used  Substance and Sexual Activity  . Alcohol use: Yes    Alcohol/week: 1.0 standard drink    Types: 1 Cans of beer per week  . Drug use: Never  . Sexual activity: Not on file  Other Topics Concern  . Not on file  Social History Narrative  . Not on file   Social Determinants of Health   Financial Resource Strain: Not on file  Food Insecurity: No Food Insecurity  . Worried About Programme researcher, broadcasting/film/video in the Last Year: Never true  .  Ran Out of Food in the Last Year: Never true  Transportation Needs: No Transportation Needs  . Lack of Transportation (Medical): No  . Lack of Transportation (Non-Medical): No  Physical Activity: Insufficiently Active  . Days of Exercise per Week: 4 days  . Minutes of Exercise per Session: 30 min  Stress: Not on file  Social Connections: Not on file   Tobacco Counseling Counseling given: Never Smoker  Clinical Intake:  Pre-visit preparation completed: Yes  Pain : No/denies pain Pain Score: 0-No pain   BMI - recorded: 26.09 Nutritional Status: BMI 25 -29 Overweight Nutritional Risks: None Diabetes: No How often do you need to have someone help you when you read instructions, pamphlets, or other written materials from your doctor or pharmacy?: 2 - Rarely  Interpreter Needed?: No   Activities of Daily Living In your present state of health, do you have any difficulty performing the following activities: 09/17/2020 10/19/2019  Hearing? Y Y  Comment hearing aids and wax build-up hearing aids  Vision? N N  Difficulty concentrating or making decisions? N N  Walking or climbing stairs? N N  Dressing or bathing? N N  Doing errands, shopping? N N  Preparing Food and eating ? N -  Using the Toilet? N -  In the past six months, have you accidently leaked urine? N -  Do you have problems with loss of bowel control? N -  Managing your Medications? N -  Managing your Finances? N -  Housekeeping or managing your Housekeeping? N -  Some recent data might be hidden    Patient Care Team: Blane Ohara, MD as PCP - General (Family Medicine)     Assessment:   This is a routine wellness examination for Melinda Miller.  Dietary issues and exercise activities discussed: Current Exercise Habits: Home exercise routine, Type of exercise: walking, Time (Minutes): 30, Frequency (Times/Week): 4, Weekly Exercise (Minutes/Week): 120, Intensity: Mild, Exercise limited by: None identified  Depression  Screen PHQ 2/9 Scores 09/17/2020 10/19/2019  PHQ - 2 Score 0 0    Fall Risk Fall Risk  09/17/2020 10/19/2019  Falls in the past year? 0 0  Number falls in past yr: 0 0  Injury with Fall? 0 0  Risk for fall due to : No Fall Risks No Fall Risks  Follow up Falls evaluation completed;Falls prevention discussed Falls prevention discussed   Cognitive Function:     6CIT Screen 09/17/2020  What Year? 0 points  What month? 0 points  What time? 0 points  Count back from 20 0 points  Months in reverse 0 points  Repeat phrase 0 points  Total Score 0    Immunizations Immunization History  Administered Date(s) Administered  . PFIZER(Purple Top)SARS-COV-2 Vaccination 06/10/2019, 07/01/2019, 02/12/2020  . Pneumococcal Polysaccharide-23 10/19/2019  . Tdap 02/17/2011    TDAP status: Up to date  Flu Vaccine status:  Up to date  Pneumococcal vaccine status: Up to date  Covid-19 vaccine status: Completed vaccines  Qualifies for Shingles Vaccine? Yes   Zostavax completed No   Shingrix Completed?: No.    Education has been provided regarding the importance of this vaccine. Patient has been advised to call insurance company to determine out of pocket expense if they have not yet received this vaccine. Advised may also receive vaccine at local pharmacy or Health Dept. Verbalized acceptance and understanding.  Screening Tests Health Maintenance  Topic Date Due  . Hepatitis C Screening  Never done  . COLONOSCOPY (Pts 45-10yrs Insurance coverage will need to be confirmed)  Never done  . DEXA SCAN  Never done  . MAMMOGRAM  08/09/2020  . PNA vac Low Risk Adult (2 of 2 - PCV13) 10/18/2020  . INFLUENZA VACCINE  12/16/2020  . TETANUS/TDAP  02/16/2021  . COVID-19 Vaccine  Completed  . HPV VACCINES  Aged Out    Health Maintenance  Health Maintenance Due  Topic Date Due  . Hepatitis C Screening  Never done  . COLONOSCOPY (Pts 45-53yrs Insurance coverage will need to be confirmed)  Never done  .  DEXA SCAN  Never done  . MAMMOGRAM  08/09/2020    Colorectal cancer screening: Type of screening: Colonoscopy. Completed at [facility], records requested  Mammogram status: Completed this year at [facility], records requested  Bone Density status: Completed at [facility], records requested  Lung Cancer Screening: (Low Dose CT Chest recommended if Age 44-80 years, 30 pack-year currently smoking OR have quit w/in 15years.) does not qualify.   Additional Screening:  Vision Screening: Recommended annual ophthalmology exams for early detection of glaucoma and other disorders of the eye. Is the patient up to date with their annual eye exam?  Yes   Dental Screening: Recommended annual dental exams for proper oral hygiene    Plan:     1- Results requested for mammogram, DEXA, and Colon Cancer Screening 2- Immunizations: PNA vaccine due after 10/18/20, 4th dose of COVID Vaccine due (patient said she will wait a little while longer) 3- Cerumen Impaction - I was able to retrieve a moderate amount of sticky wax from both ears, I was able to visualize the eardrum in the right ear however not the left.  She will use debrox drops at home and return in three days. 4- Insect bite on right side abdomen - no signs of infection, recommended clean with soap and water daily and apply cortisone cream for itching.  I have personally reviewed and noted the following in the patient's chart:   . Medical and social history . Use of alcohol, tobacco or illicit drugs  . Current medications and supplements including opioid prescriptions.  . Functional ability and status . Nutritional status . Physical activity . Advanced directives . List of other physicians . Hospitalizations, surgeries, and ER visits in previous 12 months . Vitals . Screenings to include cognitive, depression, and falls . Referrals and appointments  In addition, I have reviewed and discussed with patient certain preventive protocols,  quality metrics, and best practice recommendations. A written personalized care plan for preventive services as well as general preventive health recommendations were provided to patient.     Jacklynn Bue, LPN   [id number]

## 2020-09-28 ENCOUNTER — Encounter: Payer: Self-pay | Admitting: Family Medicine

## 2020-11-13 ENCOUNTER — Other Ambulatory Visit: Payer: Self-pay

## 2020-11-13 ENCOUNTER — Ambulatory Visit: Payer: Medicare PPO | Admitting: Family Medicine

## 2020-11-13 VITALS — BP 128/72 | HR 71 | Temp 96.0°F | Ht 64.0 in | Wt 149.0 lb

## 2020-11-13 DIAGNOSIS — E782 Mixed hyperlipidemia: Secondary | ICD-10-CM

## 2020-11-13 DIAGNOSIS — I10 Essential (primary) hypertension: Secondary | ICD-10-CM

## 2020-11-13 DIAGNOSIS — R10811 Right upper quadrant abdominal tenderness: Secondary | ICD-10-CM

## 2020-11-13 DIAGNOSIS — Z23 Encounter for immunization: Secondary | ICD-10-CM

## 2020-11-13 DIAGNOSIS — K219 Gastro-esophageal reflux disease without esophagitis: Secondary | ICD-10-CM | POA: Diagnosis not present

## 2020-11-13 MED ORDER — ROSUVASTATIN CALCIUM 5 MG PO TABS: ORAL_TABLET | ORAL | 1 refills | Status: DC

## 2020-11-13 MED ORDER — TELMISARTAN 40 MG PO TABS: 40.0000 mg | ORAL_TABLET | Freq: Every day | ORAL | 1 refills | Status: DC

## 2020-11-13 MED ORDER — FAMOTIDINE 40 MG PO TABS: 40.0000 mg | ORAL_TABLET | Freq: Every day | ORAL | 1 refills | Status: DC

## 2020-11-13 MED ORDER — MELOXICAM 15 MG PO TABS: 15.0000 mg | ORAL_TABLET | Freq: Every day | ORAL | 3 refills | Status: DC

## 2020-11-13 NOTE — Progress Notes (Signed)
Subjective:  Patient ID: Melinda Miller, female    DOB: [birth year]  Age: 70 y.o. MRN: [id number]  Chief Complaint  Patient presents with   Hyperlipidemia   Hypertension     HPI Cholesterol- takes crestor 5 mg daily, maintains a healthy diet HTN-takes micardis 40 mg daily GERD-controlled with protonix 40 mg daily C/o RUQ abdominal pain/tenderness sometimes especially after eating [place] food, pizza or burgers. Has had nausea once                                                                                                         Current Outpatient Medications on File Prior to Visit  Medication Sig Dispense Refill   augmented betamethasone dipropionate (DIPROLENE-AF) 0.05 % cream Apply topically 2 (two) times daily. 50 g 1   Calcium Carbonate-Vit D-Min (CALCIUM 1200 PO) Take 1,200 mg by mouth in the morning and at bedtime.     mometasone (NASONEX) 50 MCG/ACT nasal spray Place 2 sprays into the nose daily. 17 g 5   pantoprazole (PROTONIX) 40 MG tablet Take 1 tablet (40 mg total) by mouth daily. 90 tablet 3   No current facility-administered medications on file prior to visit.   Past Medical History:  Diagnosis Date   Essential hypertension    GERD (gastroesophageal reflux disease)    Mixed hyperlipidemia    Past Surgical History:  Procedure Laterality Date   ABDOMINAL HYSTERECTOMY      Family History  Problem Relation Age of Onset   Alzheimer's disease Mother    CAD Father    Heart attack Father    Cancer Sister        kidney   Alzheimer's disease Sister    Social History   Socioeconomic History   Marital status: Married    Spouse name: Mckenley Birenbaum   Number of children: 2   Years of education: Not on file   Highest education level: Not on file  Occupational History   Not on file  Tobacco Use   Smoking status: Never   Smokeless tobacco: Never  Substance and Sexual Activity   Alcohol use: Yes    Alcohol/week: 1.0 standard drink    Types: 1 Cans of  beer per week   Drug use: Never   Sexual activity: Not on file  Other Topics Concern   Not on file  Social History Narrative   Not on file   Social Determinants of Health   Financial Resource Strain: Not on file  Food Insecurity: No Food Insecurity   Worried About Running Out of Food in the Last Year: Never true   Ran Out of Food in the Last Year: Never true  Transportation Needs: No Transportation Needs   Lack of Transportation (Medical): No   Lack of Transportation (Non-Medical): No  Physical Activity: Insufficiently Active   Days of Exercise per Week: 4 days   Minutes of Exercise per Session: 30 min  Stress: Not on file  Social Connections: Not on file    Review of Systems  Constitutional:  Negative for chills, fatigue  and fever.  HENT:  Negative for congestion, ear pain, rhinorrhea and sore throat.   Respiratory:  Negative for cough and shortness of breath.   Cardiovascular:  Negative for chest pain.  Gastrointestinal:  Positive for abdominal pain (Sometimes-tender RUQ). Negative for constipation, diarrhea, nausea and vomiting.  Genitourinary:  Negative for dysuria and urgency.  Musculoskeletal:  Negative for back pain and myalgias.  Neurological:  Negative for dizziness, weakness, light-headedness and headaches.  Psychiatric/Behavioral:  Negative for dysphoric mood. The patient is not nervous/anxious.     Objective:  BP 128/72   Pulse 71   Temp (!) 96 F (35.6 C)   Ht 5\' 4"  (1.626 m)   Wt 149 lb (67.6 kg)   SpO2 97%   BMI 25.58 kg/m   BP/Weight 11/13/2020 09/17/2020 09/10/2020  Systolic BP 128 132 140  Diastolic BP 72 70 70  Wt. (Lbs) 149 152 151  BMI 25.58 26.09 25.92    Physical Exam Vitals reviewed.  Constitutional:      Appearance: Normal appearance. She is normal weight.  Neck:     Vascular: No carotid bruit.  Cardiovascular:     Rate and Rhythm: Normal rate and regular rhythm.     Pulses: Normal pulses.     Heart sounds: Normal heart sounds.   Pulmonary:     Effort: Pulmonary effort is normal. No respiratory distress.     Breath sounds: Normal breath sounds.  Abdominal:     General: Abdomen is flat. Bowel sounds are normal.     Palpations: Abdomen is soft.     Tenderness: There is abdominal tenderness in the right upper quadrant.  Neurological:     Mental Status: She is alert and oriented to person, place, and time.  Psychiatric:        Mood and Affect: Mood normal.        Behavior: Behavior normal.    Diabetic Foot Exam - Simple   No data filed      Lab Results  Component Value Date   WBC 7.1 11/13/2020   HGB 13.0 11/13/2020   HCT 39.3 11/13/2020   PLT 213 11/13/2020   GLUCOSE 103 (H) 11/13/2020   CHOL 175 11/13/2020   TRIG 75 11/13/2020   HDL 54 11/13/2020   LDLCALC 107 (H) 11/13/2020   ALT 17 11/13/2020   AST 21 11/13/2020   NA 140 11/13/2020   K 5.3 (H) 11/13/2020   CL 102 11/13/2020   CREATININE 1.10 (H) 11/13/2020   BUN 21 11/13/2020   CO2 22 11/13/2020   TSH 2.430 11/13/2020      Assessment & Plan:   1. Mixed hyperlipidemia Fairly Well controlled.  No changes to medicines.  Continue to work on eating a healthy diet and exercise.  Labs drawn today.  - rosuvastatin (CRESTOR) 5 MG tablet; TAKE ONE (1) TABLET ONCE DAILY  Dispense: 90 tablet; Refill: 1 - Lipid panel  2. Essential hypertension, benign Well controlled.  No changes to medicines.  Continue to work on eating a healthy diet and exercise.  Labs drawn today.  - CBC with Differential/Platelet - Comprehensive metabolic panel - TSH  3. GERD without esophagitis The current medical regimen is fairly effective;  continue present plan and medications. Add pepcid.   4. Need for vaccination - Pneumococcal conjugate vaccine 13-valent IM  5. Right upper quadrant abdominal tenderness without rebound tenderness  Recommended gallbladder eating plan. Add pepcid in case ruq pain is related to gerd.  If increases in intensity or  frequency,  may call and we will order a gallbladder ultrasound.   Meds ordered this encounter  Medications   meloxicam (MOBIC) 15 MG tablet    Sig: Take 1 tablet (15 mg total) by mouth daily.    Dispense:  90 tablet    Refill:  3   rosuvastatin (CRESTOR) 5 MG tablet    Sig: TAKE ONE (1) TABLET ONCE DAILY    Dispense:  90 tablet    Refill:  1   telmisartan (MICARDIS) 40 MG tablet    Sig: Take 1 tablet (40 mg total) by mouth daily.    Dispense:  90 tablet    Refill:  1   famotidine (PEPCID) 40 MG tablet    Sig: Take 1 tablet (40 mg total) by mouth at bedtime.    Dispense:  90 tablet    Refill:  1     Orders Placed This Encounter  Procedures   Pneumococcal conjugate vaccine 13-valent IM   CBC with Differential/Platelet   Comprehensive metabolic panel   Lipid panel   TSH   Cardiovascular Risk Assessment      Follow-up: Return in about 3 months (around 02/13/2021) for fasting.  An After Visit Summary was printed and given to the patient.  Blane Ohara, MD [facility] [phone]

## 2020-11-14 LAB — CBC WITH DIFFERENTIAL/PLATELET
Basophils Absolute: 0.1 10*3/uL (ref 0.0–0.2)
Basos: 1 %
EOS (ABSOLUTE): 0.1 10*3/uL (ref 0.0–0.4)
Eos: 2 %
Hematocrit: 39.3 % (ref 34.0–46.6)
Hemoglobin: 13 g/dL (ref 11.1–15.9)
Immature Grans (Abs): 0 10*3/uL (ref 0.0–0.1)
Immature Granulocytes: 0 %
Lymphocytes Absolute: 2.9 10*3/uL (ref 0.7–3.1)
Lymphs: 41 %
MCH: 31.5 pg (ref 26.6–33.0)
MCHC: 33.1 g/dL (ref 31.5–35.7)
MCV: 95 fL (ref 79–97)
Monocytes Absolute: 0.9 10*3/uL (ref 0.1–0.9)
Monocytes: 12 %
Neutrophils Absolute: 3.2 10*3/uL (ref 1.4–7.0)
Neutrophils: 44 %
Platelets: 213 10*3/uL (ref 150–450)
RBC: 4.13 x10E6/uL (ref 3.77–5.28)
RDW: 13.9 % (ref 11.7–15.4)
WBC: 7.1 10*3/uL (ref 3.4–10.8)

## 2020-11-14 LAB — COMPREHENSIVE METABOLIC PANEL
ALT: 17 IU/L (ref 0–32)
AST: 21 IU/L (ref 0–40)
Albumin/Globulin Ratio: 2 (ref 1.2–2.2)
Albumin: 4.8 g/dL (ref 3.8–4.8)
Alkaline Phosphatase: 97 IU/L (ref 44–121)
BUN/Creatinine Ratio: 19 (ref 12–28)
BUN: 21 mg/dL (ref 8–27)
Bilirubin Total: 0.4 mg/dL (ref 0.0–1.2)
CO2: 22 mmol/L (ref 20–29)
Calcium: 9.9 mg/dL (ref 8.7–10.3)
Chloride: 102 mmol/L (ref 96–106)
Creatinine, Ser: 1.1 mg/dL — ABNORMAL HIGH (ref 0.57–1.00)
Globulin, Total: 2.4 g/dL (ref 1.5–4.5)
Glucose: 103 mg/dL — ABNORMAL HIGH (ref 65–99)
Potassium: 5.3 mmol/L — ABNORMAL HIGH (ref 3.5–5.2)
Sodium: 140 mmol/L (ref 134–144)
Total Protein: 7.2 g/dL (ref 6.0–8.5)
eGFR: 55 mL/min/{1.73_m2} — ABNORMAL LOW (ref >59)

## 2020-11-14 LAB — LIPID PANEL
Chol/HDL Ratio: 3.2 ratio (ref 0.0–4.4)
Cholesterol, Total: 175 mg/dL (ref 100–199)
HDL: 54 mg/dL (ref >39)
LDL Chol Calc (NIH): 107 mg/dL — ABNORMAL HIGH (ref 0–99)
Triglycerides: 75 mg/dL (ref 0–149)
VLDL Cholesterol Cal: 14 mg/dL (ref 5–40)

## 2020-11-14 LAB — TSH: TSH: 2.43 u[IU]/mL (ref 0.450–4.500)

## 2020-11-14 LAB — CARDIOVASCULAR RISK ASSESSMENT

## 2020-11-15 ENCOUNTER — Other Ambulatory Visit: Payer: Self-pay

## 2020-11-15 DIAGNOSIS — N289 Disorder of kidney and ureter, unspecified: Secondary | ICD-10-CM

## 2020-11-23 ENCOUNTER — Encounter: Payer: Self-pay | Admitting: Family Medicine

## 2020-11-27 ENCOUNTER — Other Ambulatory Visit: Payer: Medicare PPO

## 2020-11-27 DIAGNOSIS — N289 Disorder of kidney and ureter, unspecified: Secondary | ICD-10-CM | POA: Diagnosis not present

## 2020-11-27 LAB — COMPREHENSIVE METABOLIC PANEL
ALT: 19 IU/L (ref 0–32)
AST: 23 IU/L (ref 0–40)
Albumin/Globulin Ratio: 2.1 (ref 1.2–2.2)
Albumin: 4.6 g/dL (ref 3.8–4.8)
Alkaline Phosphatase: 93 IU/L (ref 44–121)
BUN/Creatinine Ratio: 17 (ref 12–28)
BUN: 16 mg/dL (ref 8–27)
Bilirubin Total: 0.4 mg/dL (ref 0.0–1.2)
CO2: 22 mmol/L (ref 20–29)
Calcium: 9 mg/dL (ref 8.7–10.3)
Chloride: 98 mmol/L (ref 96–106)
Creatinine, Ser: 0.93 mg/dL (ref 0.57–1.00)
Globulin, Total: 2.2 g/dL (ref 1.5–4.5)
Glucose: 101 mg/dL — ABNORMAL HIGH (ref 65–99)
Potassium: 4.2 mmol/L (ref 3.5–5.2)
Sodium: 136 mmol/L (ref 134–144)
Total Protein: 6.8 g/dL (ref 6.0–8.5)
eGFR: 67 mL/min/{1.73_m2} (ref >59)

## 2020-12-05 DIAGNOSIS — L821 Other seborrheic keratosis: Secondary | ICD-10-CM | POA: Diagnosis not present

## 2020-12-05 DIAGNOSIS — L814 Other melanin hyperpigmentation: Secondary | ICD-10-CM | POA: Diagnosis not present

## 2021-02-17 ENCOUNTER — Encounter: Payer: Self-pay | Admitting: Family Medicine

## 2021-02-17 ENCOUNTER — Other Ambulatory Visit: Payer: Self-pay

## 2021-02-17 ENCOUNTER — Ambulatory Visit: Payer: Medicare PPO | Admitting: Family Medicine

## 2021-02-17 VITALS — BP 170/80 | HR 84 | Temp 97.0°F | Resp 16 | Ht 64.0 in | Wt 149.0 lb

## 2021-02-17 DIAGNOSIS — Z23 Encounter for immunization: Secondary | ICD-10-CM

## 2021-02-17 DIAGNOSIS — I10 Essential (primary) hypertension: Secondary | ICD-10-CM | POA: Diagnosis not present

## 2021-02-17 DIAGNOSIS — K219 Gastro-esophageal reflux disease without esophagitis: Secondary | ICD-10-CM | POA: Diagnosis not present

## 2021-02-17 DIAGNOSIS — E782 Mixed hyperlipidemia: Secondary | ICD-10-CM | POA: Diagnosis not present

## 2021-02-17 DIAGNOSIS — M1611 Unilateral primary osteoarthritis, right hip: Secondary | ICD-10-CM | POA: Insufficient documentation

## 2021-02-17 DIAGNOSIS — M25551 Pain in right hip: Secondary | ICD-10-CM | POA: Diagnosis not present

## 2021-02-17 NOTE — Progress Notes (Signed)
Subjective:  Patient ID: Melinda Miller, female    DOB: 1950/06/21  Age: 70 y.o. MRN: [id number]  Chief Complaint  Patient presents with   Hypertension    HPI Patient is a 70 year old white female who presents for follow-up of hypertension and hyperlipidemia.  Patient is currently on Crestor 5 mg once daily for cholesterol.  Her last LDL was 107.  She is currently on Micardis 40 mg once daily for her hypertension.  Her blood pressure is quite high today.  She is very worried about her granddaughter who is at [facility] in surgery right now.  Patient does check her blood pressure at home and is normally systolic 120s.  Patient has GERD.  Currently on pantoprazole 40 mg once a day and Pepcid 40 mg once daily  Patient is complaining of right hip pain.  Initially it seemed like it was in the right lumbar spine and then moved around to the right hip.  The lumbar spine and buttock has improved.  The right hip still hurts.  Patient does have gelling.  Currently on meloxicam once daily.  Occasionally takes Aleve.  The meloxicam was initially prescribed for osteoarthritis of her hands.  Current Outpatient Medications on File Prior to Visit  Medication Sig Dispense Refill   augmented betamethasone dipropionate (DIPROLENE-AF) 0.05 % cream Apply topically 2 (two) times daily. 50 g 1   Calcium Carbonate-Vit D-Min (CALCIUM 1200 PO) Take 1,200 mg by mouth in the morning and at bedtime.     famotidine (PEPCID) 40 MG tablet Take 1 tablet (40 mg total) by mouth at bedtime. 90 tablet 1   meloxicam (MOBIC) 15 MG tablet Take 1 tablet (15 mg total) by mouth daily. 90 tablet 3   mometasone (NASONEX) 50 MCG/ACT nasal spray Place 2 sprays into the nose daily. 17 g 5   pantoprazole (PROTONIX) 40 MG tablet Take 1 tablet (40 mg total) by mouth daily. 90 tablet 3   rosuvastatin (CRESTOR) 5 MG tablet TAKE ONE (1) TABLET ONCE DAILY 90 tablet 1   telmisartan (MICARDIS) 40 MG tablet Take 1 tablet (40 mg total) by  mouth daily. 90 tablet 1   No current facility-administered medications on file prior to visit.   Past Medical History:  Diagnosis Date   Essential hypertension    GERD (gastroesophageal reflux disease)    Mixed hyperlipidemia    Past Surgical History:  Procedure Laterality Date   ABDOMINAL HYSTERECTOMY      Family History  Problem Relation Age of Onset   Alzheimer's disease Mother    CAD Father    Heart attack Father    Cancer Sister        kidney   Alzheimer's disease Sister    Social History   Socioeconomic History   Marital status: Married    Spouse name: Priti Consoli   Number of children: 2   Years of education: Not on file   Highest education level: Not on file  Occupational History   Not on file  Tobacco Use   Smoking status: Never   Smokeless tobacco: Never  Substance and Sexual Activity   Alcohol use: Yes    Alcohol/week: 1.0 standard drink    Types: 1 Cans of beer per week   Drug use: Never   Sexual activity: Not on file  Other Topics Concern   Not on file  Social History Narrative   Not on file   Social Determinants of Health   Financial Resource Strain: Not on  file  Food Insecurity: No Food Insecurity   Worried About Programme researcher, broadcasting/film/video in the Last Year: Never true   Ran Out of Food in the Last Year: Never true  Transportation Needs: No Transportation Needs   Lack of Transportation (Medical): No   Lack of Transportation (Non-Medical): No  Physical Activity: Insufficiently Active   Days of Exercise per Week: 4 days   Minutes of Exercise per Session: 30 min  Stress: Not on file  Social Connections: Not on file    Review of Systems  Constitutional:  Negative for chills, fatigue and fever.  HENT:  Negative for congestion, rhinorrhea and sore throat.   Respiratory:  Negative for cough and shortness of breath.   Cardiovascular:  Negative for chest pain.  Gastrointestinal:  Negative for abdominal pain, constipation, diarrhea, nausea and  vomiting.  Genitourinary:  Negative for dysuria and urgency.  Musculoskeletal:  Positive for arthralgias (right hip pain) and back pain. Negative for myalgias.  Neurological:  Negative for dizziness, weakness, light-headedness and headaches.  Psychiatric/Behavioral:  Negative for dysphoric mood. The patient is not nervous/anxious.     Objective:  BP (!) 170/80   Pulse 84   Temp (!) 97 F (36.1 C)   Resp 16   Ht 5\' 4"  (1.626 m)   Wt 149 lb (67.6 kg)   BMI 25.58 kg/m   BP/Weight 02/26/2021 02/17/2021 11/13/2020  Systolic BP 102 170 128  Diastolic BP 72 80 72  Wt. (Lbs) - 149 149  BMI - 25.58 25.58    Physical Exam Vitals reviewed.  Constitutional:      Appearance: Normal appearance. She is normal weight.  Neck:     Vascular: No carotid bruit.  Cardiovascular:     Rate and Rhythm: Normal rate and regular rhythm.     Pulses: Normal pulses.     Heart sounds: Normal heart sounds.  Pulmonary:     Effort: Pulmonary effort is normal. No respiratory distress.     Breath sounds: Normal breath sounds.  Abdominal:     General: Abdomen is flat. Bowel sounds are normal.     Palpations: Abdomen is soft.     Tenderness: There is no abdominal tenderness.  Musculoskeletal:     Comments: Discomfort with internal rotation of her right hip.  Flexion and external rotation were normal.  Mild right trochanteric tenderness.  No lumbar or buttock tenderness. Left hip full range of motion normal.  No left trochanteric bursa tenderness.  Neurological:     Mental Status: She is alert and oriented to person, place, and time.  Psychiatric:        Mood and Affect: Mood normal.        Behavior: Behavior normal.    Diabetic Foot Exam - Simple   No data filed      Lab Results  Component Value Date   WBC 5.9 02/17/2021   HGB 12.5 02/17/2021   HCT 37.3 02/17/2021   PLT 200 02/17/2021   GLUCOSE 105 (H) 02/17/2021   CHOL 156 02/17/2021   TRIG 101 02/17/2021   HDL 51 02/17/2021   LDLCALC 86  02/17/2021   ALT 15 02/17/2021   AST 20 02/17/2021   NA 142 02/17/2021   K 4.6 02/17/2021   CL 104 02/17/2021   CREATININE 1.06 (H) 02/17/2021   BUN 15 02/17/2021   CO2 22 02/17/2021   TSH 2.430 11/13/2020      Assessment & Plan:   Problem List Items Addressed This Visit  Cardiovascular and Mediastinum   RESOLVED: Essential hypertension, benign - Primary   Relevant Orders   CBC with Differential/Platelet (Completed)   Comprehensive metabolic panel (Completed)     Digestive   GERD without esophagitis    Well-controlled.  Continue Pepcid 40 mg once daily and Protonix 40 mg once daily.        Other   Mixed hyperlipidemia    Improved.  Continue Crestor 5 mg once daily.      Relevant Orders   Lipid panel (Completed)   Right hip pain    Order right hip x-ray. Continue meloxicam. Add Tylenol OTC directions as needed.      Relevant Orders   DG Hip Unilat W OR W/O Pelvis 1V Right (Completed)   Other Visit Diagnoses     Need for influenza vaccination       Relevant Orders   Flu Vaccine QUAD High Dose(Fluad) (Completed)     .  No orders of the defined types were placed in this encounter.   Orders Placed This Encounter  Procedures   DG Hip Unilat W OR W/O Pelvis 1V Right   Flu Vaccine QUAD High Dose(Fluad)   CBC with Differential/Platelet   Comprehensive metabolic panel   Lipid panel   Cardiovascular Risk Assessment     Follow-up: Return in about 4 months (around 06/20/2021) for chronic fasting, Nurse visit in 1 week for blood pressure check and COVID booster.Marland Kitchen  An After Visit Summary was printed and given to the patient.  Blane Ohara, MD [facility] [phone]

## 2021-02-17 NOTE — Assessment & Plan Note (Signed)
Order right hip x-ray. Continue meloxicam. Add Tylenol OTC directions as needed.

## 2021-02-18 LAB — COMPREHENSIVE METABOLIC PANEL
ALT: 15 IU/L (ref 0–32)
AST: 20 IU/L (ref 0–40)
Albumin/Globulin Ratio: 2 (ref 1.2–2.2)
Albumin: 4.5 g/dL (ref 3.8–4.8)
Alkaline Phosphatase: 100 IU/L (ref 44–121)
BUN/Creatinine Ratio: 14 (ref 12–28)
BUN: 15 mg/dL (ref 8–27)
Bilirubin Total: 0.3 mg/dL (ref 0.0–1.2)
CO2: 22 mmol/L (ref 20–29)
Calcium: 9.5 mg/dL (ref 8.7–10.3)
Chloride: 104 mmol/L (ref 96–106)
Creatinine, Ser: 1.06 mg/dL — ABNORMAL HIGH (ref 0.57–1.00)
Globulin, Total: 2.2 g/dL (ref 1.5–4.5)
Glucose: 105 mg/dL — ABNORMAL HIGH (ref 70–99)
Potassium: 4.6 mmol/L (ref 3.5–5.2)
Sodium: 142 mmol/L (ref 134–144)
Total Protein: 6.7 g/dL (ref 6.0–8.5)
eGFR: 58 mL/min/{1.73_m2} — ABNORMAL LOW (ref >59)

## 2021-02-18 LAB — CBC WITH DIFFERENTIAL/PLATELET
Basophils Absolute: 0 10*3/uL (ref 0.0–0.2)
Basos: 1 %
EOS (ABSOLUTE): 0.2 10*3/uL (ref 0.0–0.4)
Eos: 3 %
Hematocrit: 37.3 % (ref 34.0–46.6)
Hemoglobin: 12.5 g/dL (ref 11.1–15.9)
Immature Grans (Abs): 0 10*3/uL (ref 0.0–0.1)
Immature Granulocytes: 0 %
Lymphocytes Absolute: 2.8 10*3/uL (ref 0.7–3.1)
Lymphs: 47 %
MCH: 32.4 pg (ref 26.6–33.0)
MCHC: 33.5 g/dL (ref 31.5–35.7)
MCV: 97 fL (ref 79–97)
Monocytes Absolute: 0.8 10*3/uL (ref 0.1–0.9)
Monocytes: 13 %
Neutrophils Absolute: 2.1 10*3/uL (ref 1.4–7.0)
Neutrophils: 36 %
Platelets: 200 10*3/uL (ref 150–450)
RBC: 3.86 x10E6/uL (ref 3.77–5.28)
RDW: 13.1 % (ref 11.7–15.4)
WBC: 5.9 10*3/uL (ref 3.4–10.8)

## 2021-02-18 LAB — LIPID PANEL
Chol/HDL Ratio: 3.1 ratio (ref 0.0–4.4)
Cholesterol, Total: 156 mg/dL (ref 100–199)
HDL: 51 mg/dL (ref >39)
LDL Chol Calc (NIH): 86 mg/dL (ref 0–99)
Triglycerides: 101 mg/dL (ref 0–149)
VLDL Cholesterol Cal: 19 mg/dL (ref 5–40)

## 2021-02-18 NOTE — Progress Notes (Signed)
Blood count normal.  Liver function normal.  Kidney function fairly stable.  Cholesterol: LDL improved. Lipids at goal.

## 2021-02-26 ENCOUNTER — Ambulatory Visit (INDEPENDENT_AMBULATORY_CARE_PROVIDER_SITE_OTHER): Payer: Medicare PPO

## 2021-02-26 ENCOUNTER — Ambulatory Visit: Payer: Medicare PPO

## 2021-02-26 ENCOUNTER — Other Ambulatory Visit: Payer: Self-pay

## 2021-02-26 VITALS — BP 102/72

## 2021-02-26 DIAGNOSIS — I10 Essential (primary) hypertension: Secondary | ICD-10-CM

## 2021-02-26 DIAGNOSIS — Z23 Encounter for immunization: Secondary | ICD-10-CM | POA: Diagnosis not present

## 2021-02-26 NOTE — Progress Notes (Signed)
Patient in office for BP check. Pt has not been recently checking BP at home. BP in office is 102/72. Will send chart to provider.   Lorita Officer, [place] 02/26/21 8:19 AM

## 2021-03-01 ENCOUNTER — Encounter: Payer: Self-pay | Admitting: Family Medicine

## 2021-03-01 NOTE — Assessment & Plan Note (Signed)
Well-controlled.  Continue Pepcid 40 mg once daily and Protonix 40 mg once daily.

## 2021-03-01 NOTE — Assessment & Plan Note (Addendum)
Uncontrolled today.  Likely due to stress. Blood pressure has been well at home. Continue Micardis 40 mg once daily

## 2021-03-01 NOTE — Assessment & Plan Note (Signed)
Improved.  Continue Crestor 5 mg once daily.

## 2021-03-13 NOTE — Progress Notes (Signed)
Excellent bp. No changes to medicines. kc

## 2021-03-31 ENCOUNTER — Encounter: Payer: Self-pay | Admitting: Physician Assistant

## 2021-03-31 ENCOUNTER — Other Ambulatory Visit: Payer: Self-pay

## 2021-03-31 ENCOUNTER — Ambulatory Visit: Payer: Medicare PPO | Admitting: Physician Assistant

## 2021-03-31 VITALS — BP 140/82 | HR 83 | Temp 97.3°F | Ht 64.0 in | Wt 148.2 lb

## 2021-03-31 DIAGNOSIS — N3000 Acute cystitis without hematuria: Secondary | ICD-10-CM | POA: Diagnosis not present

## 2021-03-31 DIAGNOSIS — N3001 Acute cystitis with hematuria: Secondary | ICD-10-CM | POA: Diagnosis not present

## 2021-03-31 LAB — POCT URINALYSIS DIP (CLINITEK)
Bilirubin, UA: NEGATIVE
Glucose, UA: NEGATIVE mg/dL
Ketones, POC UA: NEGATIVE mg/dL
Nitrite, UA: NEGATIVE
POC PROTEIN,UA: 30 — AB
Spec Grav, UA: 1.01 (ref 1.010–1.025)
Urobilinogen, UA: 0.2 E.U./dL
pH, UA: 6 (ref 5.0–8.0)

## 2021-03-31 MED ORDER — NITROFURANTOIN MONOHYD MACRO 100 MG PO CAPS: 100.0000 mg | ORAL_CAPSULE | Freq: Two times a day (BID) | ORAL | 0 refills | Status: DC

## 2021-03-31 MED ORDER — PHENAZOPYRIDINE HCL 200 MG PO TABS: 200.0000 mg | ORAL_TABLET | Freq: Three times a day (TID) | ORAL | 0 refills | Status: DC | PRN

## 2021-03-31 NOTE — Progress Notes (Signed)
Acute Office Visit  Subjective:    Patient ID: Melinda Miller, female    DOB: [birth year], 70 y.o.   MRN: [id number]  Chief Complaint  Patient presents with   Urinary Tract Infection    HPI: Patient is in today for complaints of dysuria frequency and urgency since Friday Denies nausea, vomiting, fever or abdominal pain Has not had recent UTI  Of note pt bp is elevated today - she denies chest pain or sob - currently on micardis 75m qd  Past Medical History:  Diagnosis Date   Essential hypertension    GERD (gastroesophageal reflux disease)    Mixed hyperlipidemia     Past Surgical History:  Procedure Laterality Date   ABDOMINAL HYSTERECTOMY      Family History  Problem Relation Age of Onset   Alzheimer's disease Mother    CAD Father    Heart attack Father    Cancer Sister        kidney   Alzheimer's disease Sister     Social History   Socioeconomic History   Marital status: Married    Spouse name: SMaiah Sinning  Number of children: 2   Years of education: Not on file   Highest education level: Not on file  Occupational History   Not on file  Tobacco Use   Smoking status: Never   Smokeless tobacco: Never  Substance and Sexual Activity   Alcohol use: Yes    Alcohol/week: 1.0 standard drink    Types: 1 Cans of beer per week   Drug use: Never   Sexual activity: Not on file  Other Topics Concern   Not on file  Social History Narrative   Not on file   Social Determinants of Health   Financial Resource Strain: Not on file  Food Insecurity: No Food Insecurity   Worried About Running Out of Food in the Last Year: Never true   Ran Out of Food in the Last Year: Never true  Transportation Needs: No Transportation Needs   Lack of Transportation (Medical): No   Lack of Transportation (Non-Medical): No  Physical Activity: Insufficiently Active   Days of Exercise per Week: 4 days   Minutes of Exercise per Session: 30 min  Stress: Not on file  Social  Connections: Not on file  Intimate Partner Violence: Not At Risk   Fear of Current or Ex-Partner: No   Emotionally Abused: No   Physically Abused: No   Sexually Abused: No    Outpatient Medications Prior to Visit  Medication Sig Dispense Refill   augmented betamethasone dipropionate (DIPROLENE-AF) 0.05 % cream Apply topically 2 (two) times daily. 50 g 1   Calcium Carbonate-Vit D-Min (CALCIUM 1200 PO) Take 1,200 mg by mouth in the morning and at bedtime.     famotidine (PEPCID) 40 MG tablet Take 1 tablet (40 mg total) by mouth at bedtime. 90 tablet 1   meloxicam (MOBIC) 15 MG tablet Take 1 tablet (15 mg total) by mouth daily. 90 tablet 3   mometasone (NASONEX) 50 MCG/ACT nasal spray Place 2 sprays into the nose daily. 17 g 5   pantoprazole (PROTONIX) 40 MG tablet Take 1 tablet (40 mg total) by mouth daily. 90 tablet 3   rosuvastatin (CRESTOR) 5 MG tablet TAKE ONE (1) TABLET ONCE DAILY 90 tablet 1   telmisartan (MICARDIS) 40 MG tablet Take 1 tablet (40 mg total) by mouth daily. 90 tablet 1   No facility-administered medications prior to visit.  Allergies  Allergen Reactions   Sulfa Antibiotics Hives    Review of Systems CONSTITUTIONAL: Negative for chills, fatigue, fever, unintentional weight gain and unintentional weight loss.  CARDIOVASCULAR: Negative for chest pain, dizziness, palpitations and pedal edema.  RESPIRATORY: Negative for recent cough and dyspnea.           Objective:    Physical Exam  BP 140/82 (BP Location: Left Arm, Patient Position: Sitting, Cuff Size: Normal)   Pulse 83   Temp (!) 97.3 F (36.3 C) (Temporal)   Ht _0  (1.626 m)   Wt 148 lb 3.2 oz (67.2 kg)   SpO2 98%   BMI 25.44 kg/m  Wt Readings from Last 3 Encounters:  03/31/21 148 lb 3.2 oz (67.2 kg)  02/17/21 149 lb (67.6 kg)  11/13/20 149 lb (67.6 kg)  PHYSICAL EXAM:   VS: BP 140/82 (BP Location: Left Arm, Patient Position: Sitting, Cuff Size: Normal)   Pulse 83   Temp (!) 97.3 F  (36.3 C) (Temporal)   Ht _1  (1.626 m)   Wt 148 lb 3.2 oz (67.2 kg)   SpO2 98%   BMI 25.44 kg/m   GEN: Well nourished, well developed, in no acute distress  Cardiac: RRR; no murmurs,  Respiratory:  normal respiratory rate and pattern with no distress - normal breath sounds with no rales, rhonchi, wheezes or rubs GI: normal bowel sounds, no masses or tenderness Skin: warm and dry, no rash  Psych: euthymic mood, appropriate affect and demeanor  Office Visit on 03/31/2021  Component Date Value Ref Range Status   Color, UA 03/31/2021 colorless (A)  yellow Final   Clarity, UA 03/31/2021 clear  clear Final   Glucose, UA 03/31/2021 negative  negative mg/dL Final   Bilirubin, UA 03/31/2021 negative  negative Final   Ketones, POC UA 03/31/2021 negative  negative mg/dL Final   Spec Grav, UA 03/31/2021 1.010  1.010 - 1.025 Final   Blood, UA 03/31/2021 large (A)  negative Final   pH, UA 03/31/2021 6.0  5.0 - 8.0 Final   POC PROTEIN,UA 03/31/2021 =30 (A)  negative, trace Final   Urobilinogen, UA 03/31/2021 0.2  0.2 or 1.0 E.U./dL Final   Nitrite, UA 03/31/2021 Negative  Negative Final   Leukocytes, UA 03/31/2021 Large (3+) (A)  Negative Final    Health Maintenance Due  Topic Date Due   Hepatitis C Screening  Never done   COLONOSCOPY (Pts 45-48yr Insurance coverage will need to be confirmed)  Never done   Zoster Vaccines- Shingrix (1 of 2) Never done   DEXA SCAN  Never done   TETANUS/TDAP  02/16/2021    There are no preventive care reminders to display for this patient.   Lab Results  Component Value Date   TSH 2.430 11/13/2020   Lab Results  Component Value Date   WBC 5.9 02/17/2021   HGB 12.5 02/17/2021   HCT 37.3 02/17/2021   MCV 97 02/17/2021   PLT 200 02/17/2021   Lab Results  Component Value Date   NA 142 02/17/2021   K 4.6 02/17/2021   CO2 22 02/17/2021   GLUCOSE 105 (H) 02/17/2021   BUN 15 02/17/2021   CREATININE 1.06 (H) 02/17/2021   BILITOT 0.3 02/17/2021    ALKPHOS 100 02/17/2021   AST 20 02/17/2021   ALT 15 02/17/2021   PROT 6.7 02/17/2021   ALBUMIN 4.5 02/17/2021   CALCIUM 9.5 02/17/2021   EGFR 58 (L) 02/17/2021   Lab Results  Component Value Date  CHOL 156 02/17/2021   Lab Results  Component Value Date   HDL 51 02/17/2021   Lab Results  Component Value Date   LDLCALC 86 02/17/2021   Lab Results  Component Value Date   TRIG 101 02/17/2021   Lab Results  Component Value Date   CHOLHDL 3.1 02/17/2021   No results found for: HGBA1C     Assessment & Plan:   Problem List Items Addressed This Visit   None Visit Diagnoses     Acute infective cystitis    -  Primary   Relevant Medications   nitrofurantoin, macrocrystal-monohydrate, (MACROBID) 100 MG capsule   phenazopyridine (PYRIDIUM) 200 MG tablet   Other Relevant Orders   POCT URINALYSIS DIP (CLINITEK) (Completed)   Urine Culture      Meds ordered this encounter  Medications   nitrofurantoin, macrocrystal-monohydrate, (MACROBID) 100 MG capsule    Sig: Take 1 capsule (100 mg total) by mouth 2 (two) times daily.    Dispense:  20 capsule    Refill:  0    Order Specific Question:   Supervising Provider    Answer:   Shelton Silvas   phenazopyridine (PYRIDIUM) 200 MG tablet    Sig: Take 1 tablet (200 mg total) by mouth 3 (three) times daily as needed for pain.    Dispense:  10 tablet    Refill:  0    Order Specific Question:   Supervising Provider    AnswerShelton Silvas    Orders Placed This Encounter  Procedures   Urine Culture   POCT URINALYSIS DIP (CLINITEK)     Follow-up: No follow-ups on file.  An After Visit Summary was printed and given to the patient.  Yetta Flock [facility] [phone]

## 2021-04-05 LAB — URINE CULTURE

## 2021-04-07 ENCOUNTER — Other Ambulatory Visit: Payer: Self-pay | Admitting: Family Medicine

## 2021-04-21 ENCOUNTER — Ambulatory Visit (INDEPENDENT_AMBULATORY_CARE_PROVIDER_SITE_OTHER): Payer: Medicare PPO

## 2021-04-21 ENCOUNTER — Other Ambulatory Visit: Payer: Self-pay

## 2021-04-21 DIAGNOSIS — N3000 Acute cystitis without hematuria: Secondary | ICD-10-CM | POA: Diagnosis not present

## 2021-04-21 LAB — POCT URINALYSIS DIPSTICK
Bilirubin, UA: NEGATIVE
Blood, UA: NEGATIVE
Glucose, UA: NEGATIVE
Ketones, UA: NEGATIVE
Leukocytes, UA: NEGATIVE
Nitrite, UA: NEGATIVE
Protein, UA: NEGATIVE
Spec Grav, UA: 1.015 (ref 1.010–1.025)
Urobilinogen, UA: 0.2 E.U./dL
pH, UA: 6 (ref 5.0–8.0)

## 2021-04-21 NOTE — Progress Notes (Signed)
Melinda Miller comes in for recheck of her urine.

## 2021-04-26 DIAGNOSIS — J101 Influenza due to other identified influenza virus with other respiratory manifestations: Secondary | ICD-10-CM | POA: Diagnosis not present

## 2021-04-26 DIAGNOSIS — Z20822 Contact with and (suspected) exposure to covid-19: Secondary | ICD-10-CM | POA: Diagnosis not present

## 2021-04-29 ENCOUNTER — Ambulatory Visit: Payer: Medicare PPO | Admitting: Nurse Practitioner

## 2021-04-29 ENCOUNTER — Encounter: Payer: Self-pay | Admitting: Nurse Practitioner

## 2021-04-29 VITALS — BP 118/64 | HR 76 | Temp 96.7°F | Ht 64.0 in | Wt 144.0 lb

## 2021-04-29 DIAGNOSIS — J101 Influenza due to other identified influenza virus with other respiratory manifestations: Secondary | ICD-10-CM

## 2021-04-29 DIAGNOSIS — R051 Acute cough: Secondary | ICD-10-CM

## 2021-04-29 DIAGNOSIS — J111 Influenza due to unidentified influenza virus with other respiratory manifestations: Secondary | ICD-10-CM | POA: Diagnosis not present

## 2021-04-29 MED ORDER — PROMETHAZINE-DM 6.25-15 MG/5ML PO SYRP
5.0000 mL | ORAL_SOLUTION | Freq: Four times a day (QID) | ORAL | 0 refills | Status: DC | PRN
Start: 2021-04-29 — End: 2021-06-23

## 2021-04-29 MED ORDER — ALBUTEROL SULFATE HFA 108 (90 BASE) MCG/ACT IN AERS: 2.0000 | INHALATION_SPRAY | Freq: Four times a day (QID) | RESPIRATORY_TRACT | 0 refills | Status: DC | PRN

## 2021-04-29 MED ORDER — FLUTICASONE PROPIONATE 50 MCG/ACT NA SUSP: 2.0000 | Freq: Every day | NASAL | 6 refills | Status: DC

## 2021-04-29 NOTE — Patient Instructions (Signed)
Use Flonase nasal spray daily  Take Promethazine-DM as needed up to 4 times daily Push fluids, especially water Continue Tamiflu as directed Follow-up as needed   Influenza, Adult Influenza, also called "the flu," is a viral infection that mainly affects the respiratory tract. This includes the lungs, nose, and throat. The flu spreads easily from person to person (is contagious). It causes common cold symptoms, along with high fever and body aches. What are the causes? This condition is caused by the influenza virus. You can get the virus by: Breathing in droplets that are in the air from an infected person's cough or sneeze. Touching something that has the virus on it (has been contaminated) and then touching your mouth, nose, or eyes. What increases the risk? The following factors may make you more likely to get the flu: Not washing or sanitizing your hands often. Having close contact with many people during cold and flu season. Touching your mouth, eyes, or nose without first washing or sanitizing your hands. Not getting an annual flu shot. You may have a higher risk for the flu, including serious problems, such as a lung infection (pneumonia), if you: Are older than 65. Are pregnant. Have a weakened disease-fighting system (immune system). This includes people who have HIV or AIDS, are on chemotherapy, or are taking medicines that reduce (suppress) the immune system. Have a long-term (chronic) illness, such as heart disease, kidney disease, diabetes, or lung disease. Have a liver disorder. Are severely overweight (morbidly obese). Have anemia. Have asthma. What are the signs or symptoms? Symptoms of this condition usually begin suddenly and last 4-14 days. These may include: Fever and chills. Headaches, body aches, or muscle aches. Sore throat. Cough. Runny or stuffy (congested) nose. Chest discomfort. Poor appetite. Weakness or fatigue. Dizziness. Nausea or vomiting. How  is this diagnosed? This condition may be diagnosed based on: Your symptoms and medical history. A physical exam. Swabbing your nose or throat and testing the fluid for the influenza virus. How is this treated? If the flu is diagnosed early, you can be treated with antiviral medicine that is given by mouth (orally) or through an IV. This can help reduce how severe the illness is and how long it lasts. Taking care of yourself at home can help relieve symptoms. Your health care provider may recommend: Taking over-the-counter medicines. Drinking plenty of fluids. In many cases, the flu goes away on its own. If you have severe symptoms or complications, you may be treated in a hospital. Follow these instructions at home: Activity Rest as needed and get plenty of sleep. Stay home from work or school as told by your health care provider. Unless you are visiting your health care provider, avoid leaving home until your fever has been gone for 24 hours without taking medicine. Eating and drinking Take an oral rehydration solution (ORS). This is a drink that is sold at pharmacies and retail stores. Drink enough fluid to keep your urine pale yellow. Drink clear fluids in small amounts as you are able. Clear fluids include water, ice chips, fruit juice mixed with water, and low-calorie sports drinks. Eat bland, easy-to-digest foods in small amounts as you are able. These foods include bananas, applesauce, rice, lean meats, toast, and crackers. Avoid drinking fluids that contain a lot of sugar or caffeine, such as energy drinks, regular sports drinks, and soda. Avoid alcohol. Avoid spicy or fatty foods. General instructions   Take over-the-counter and prescription medicines only as told by your health care  provider. Use a cool mist humidifier to add humidity to the air in your home. This can make it easier to breathe. When using a cool mist humidifier, clean it daily. Empty the water and replace it with  clean water. Cover your mouth and nose when you cough or sneeze. Wash your hands with soap and water often and for at least 20 seconds, especially after you cough or sneeze. If soap and water are not available, use alcohol-based hand sanitizer. Keep all follow-up visits. This is important. How is this prevented?  Get an annual flu shot. This is usually available in late summer, fall, or winter. Ask your health care provider when you should get your flu shot. Avoid contact with people who are sick during cold and flu season. This is generally fall and winter. Contact a health care provider if: You develop new symptoms. You have: Chest pain. Diarrhea. A fever. Your cough gets worse. You produce more mucus. You feel nauseous or you vomit. Get help right away if you: Develop shortness of breath or have difficulty breathing. Have skin or nails that turn a bluish color. Have severe pain or stiffness in your neck. Develop a sudden headache or sudden pain in your face or ear. Cannot eat or drink without vomiting. These symptoms may represent a serious problem that is an emergency. Do not wait to see if the symptoms will go away. Get medical help right away. Call your local emergency services (911 in the [place] Do not drive yourself to the hospital. Summary Influenza, also called "the flu," is a viral infection that primarily affects your respiratory tract. Symptoms of the flu usually begin suddenly and last 4-14 days. Getting an annual flu shot is the best way to prevent getting the flu. Stay home from work or school as told by your health care provider. Unless you are visiting your health care provider, avoid leaving home until your fever has been gone for 24 hours without taking medicine. Keep all follow-up visits. This is important. This information is not intended to replace advice given to you by your health care provider. Make sure you discuss any questions you have with your health care  provider. Document Revised: 12/22/2019 Document Reviewed: 12/22/2019 Elsevier Patient Education  2022 [employer].

## 2021-04-29 NOTE — Progress Notes (Signed)
Acute Office Visit  Subjective:    Patient ID: Melinda Miller, female    DOB: 1950-06-04, 70 y.o.   MRN: [id number]  Chief Complaint  Patient presents with   Cough    HPI: Patient is in today for Upper respiratory symptoms She complains of cough described as nonproductive. Onset of symptoms was four days ago. Was seen at Urgent Care on 04/26/21, positive for Flu, prescribed Tamiflu and Tessalon Perles. States her cough worsens at night. Denies any other symptoms.  Past Medical History:  Diagnosis Date   Essential hypertension    GERD (gastroesophageal reflux disease)    Mixed hyperlipidemia     Past Surgical History:  Procedure Laterality Date   ABDOMINAL HYSTERECTOMY      Family History  Problem Relation Age of Onset   Alzheimer's disease Mother    CAD Father    Heart attack Father    Cancer Sister        kidney   Alzheimer's disease Sister     Social History   Socioeconomic History   Marital status: Married    Spouse name: Kensley Valladares   Number of children: 2   Years of education: Not on file   Highest education level: Not on file  Occupational History   Not on file  Tobacco Use   Smoking status: Never   Smokeless tobacco: Never  Substance and Sexual Activity   Alcohol use: Yes    Alcohol/week: 1.0 standard drink    Types: 1 Cans of beer per week   Drug use: Never   Sexual activity: Not on file  Other Topics Concern   Not on file  Social History Narrative   Not on file   Social Determinants of Health   Financial Resource Strain: Not on file  Food Insecurity: No Food Insecurity   Worried About Running Out of Food in the Last Year: Never true   Ran Out of Food in the Last Year: Never true  Transportation Needs: No Transportation Needs   Lack of Transportation (Medical): No   Lack of Transportation (Non-Medical): No  Physical Activity: Insufficiently Active   Days of Exercise per Week: 4 days   Minutes of Exercise per Session: 30 min   Stress: Not on file  Social Connections: Not on file  Intimate Partner Violence: Not At Risk   Fear of Current or Ex-Partner: No   Emotionally Abused: No   Physically Abused: No   Sexually Abused: No    Outpatient Medications Prior to Visit  Medication Sig Dispense Refill   augmented betamethasone dipropionate (DIPROLENE-AF) 0.05 % cream APPLY TO AFFECTED AREA TWICE A DAY 50 g 1   benzonatate (TESSALON) 200 MG capsule Take 200 mg by mouth 3 (three) times daily as needed.     Calcium Carbonate-Vit D-Min (CALCIUM 1200 PO) Take 1,200 mg by mouth in the morning and at bedtime.     famotidine (PEPCID) 40 MG tablet Take 1 tablet (40 mg total) by mouth at bedtime. 90 tablet 1   meloxicam (MOBIC) 15 MG tablet Take 1 tablet (15 mg total) by mouth daily. 90 tablet 3   mometasone (NASONEX) 50 MCG/ACT nasal spray Place 2 sprays into the nose daily. 17 g 5   nitrofurantoin, macrocrystal-monohydrate, (MACROBID) 100 MG capsule Take 1 capsule (100 mg total) by mouth 2 (two) times daily. 20 capsule 0   pantoprazole (PROTONIX) 40 MG tablet Take 1 tablet (40 mg total) by mouth daily. 90 tablet 3  phenazopyridine (PYRIDIUM) 200 MG tablet Take 1 tablet (200 mg total) by mouth 3 (three) times daily as needed for pain. 10 tablet 0   rosuvastatin (CRESTOR) 5 MG tablet TAKE ONE (1) TABLET ONCE DAILY 90 tablet 1   telmisartan (MICARDIS) 40 MG tablet Take 1 tablet (40 mg total) by mouth daily. 90 tablet 1   No facility-administered medications prior to visit.    Allergies  Allergen Reactions   Sulfa Antibiotics Hives    Review of Systems  Constitutional:  Negative for chills, fatigue and fever.  HENT:  Positive for hearing loss (wears hearing aids). Negative for congestion, ear pain, postnasal drip, rhinorrhea, sinus pressure, sinus pain and sore throat.   Respiratory:  Positive for cough and chest tightness (intermittent with cough). Negative for shortness of breath.   Cardiovascular:  Negative for chest  pain.  Allergic/Immunologic: Positive for environmental allergies.  All other systems reviewed and are negative.     Objective:    Physical Exam Vitals reviewed.  Constitutional:      Appearance: Normal appearance.  HENT:     Head: Normocephalic.     Nose: Nose normal.     Mouth/Throat:     Mouth: Mucous membranes are moist.  Eyes:     Pupils: Pupils are equal, round, and reactive to light.  Cardiovascular:     Rate and Rhythm: Normal rate and regular rhythm.     Pulses: Normal pulses.     Heart sounds: Normal heart sounds.  Pulmonary:     Effort: Pulmonary effort is normal.     Comments: Lung sounds diminished in posterior bilateral upper and lower  Abdominal:     General: Bowel sounds are normal.     Palpations: Abdomen is soft.  Musculoskeletal:        General: Normal range of motion.     Cervical back: Neck supple.  Skin:    General: Skin is warm and dry.     Capillary Refill: Capillary refill takes less than 2 seconds.  Neurological:     General: No focal deficit present.     Mental Status: She is alert and oriented to person, place, and time.  Psychiatric:        Mood and Affect: Mood normal.        Behavior: Behavior normal.    Temp (!) 96.7 F (35.9 C)    Ht 5' 4" (1.626 m)    Wt 144 lb (65.3 kg)    BMI 24.72 kg/m  BP 118/64    Pulse 76    Temp (!) 96.7 F (35.9 C)    Ht 5' 4" (1.626 m)    Wt 144 lb (65.3 kg)    SpO2 99%    BMI 24.72 kg/m   Wt Readings from Last 3 Encounters:  04/29/21 144 lb (65.3 kg)  03/31/21 148 lb 3.2 oz (67.2 kg)  02/17/21 149 lb (67.6 kg)    Health Maintenance Due  Topic Date Due   Hepatitis C Screening  Never done   COLONOSCOPY (Pts 45-84yr Insurance coverage will need to be confirmed)  Never done   Zoster Vaccines- Shingrix (1 of 2) Never done   DEXA SCAN  Never done   TETANUS/TDAP  02/16/2021    Lab Results  Component Value Date   TSH 2.430 11/13/2020   Lab Results  Component Value Date   WBC 5.9 02/17/2021    HGB 12.5 02/17/2021   HCT 37.3 02/17/2021   MCV 97 02/17/2021  PLT 200 02/17/2021   Lab Results  Component Value Date   NA 142 02/17/2021   K 4.6 02/17/2021   CO2 22 02/17/2021   GLUCOSE 105 (H) 02/17/2021   BUN 15 02/17/2021   CREATININE 1.06 (H) 02/17/2021   BILITOT 0.3 02/17/2021   ALKPHOS 100 02/17/2021   AST 20 02/17/2021   ALT 15 02/17/2021   PROT 6.7 02/17/2021   ALBUMIN 4.5 02/17/2021   CALCIUM 9.5 02/17/2021   EGFR 58 (L) 02/17/2021   Lab Results  Component Value Date   CHOL 156 02/17/2021   Lab Results  Component Value Date   HDL 51 02/17/2021   Lab Results  Component Value Date   LDLCALC 86 02/17/2021   Lab Results  Component Value Date   TRIG 101 02/17/2021   Lab Results  Component Value Date   CHOLHDL 3.1 02/17/2021        Assessment & Plan:   1. Influenza A - promethazine-dextromethorphan (PROMETHAZINE-DM) 6.25-15 MG/5ML syrup; Take 5 mLs by mouth 4 (four) times daily as needed.  Dispense: 118 mL; Refill: 0 - fluticasone (FLONASE) 50 MCG/ACT nasal spray; Place 2 sprays into both nostrils daily.  Dispense: 16 g; Refill: 6  2. Acute cough - promethazine-dextromethorphan (PROMETHAZINE-DM) 6.25-15 MG/5ML syrup; Take 5 mLs by mouth 4 (four) times daily as needed.  Dispense: 118 mL; Refill: 0 - fluticasone (FLONASE) 50 MCG/ACT nasal spray; Place 2 sprays into both nostrils daily.  Dispense: 16 g; Refill: 6  3. Bronchitis with influenza - albuterol (VENTOLIN HFA) 108 (90 Base) MCG/ACT inhaler; Inhale 2 puffs into the lungs every 6 (six) hours as needed for wheezing or shortness of breath.  Dispense: 8 g; Refill: 0   Use Flonase nasal spray daily  Take Promethazine-DM as needed up to 4 times daily Push fluids, especially water Continue Tamiflu as directed Follow-up as needed      Follow-up: PRN  An After Visit Summary was printed and given to the patient.  I, Rip Harbour, NP, have reviewed all documentation for this visit. The  documentation on 04/29/21 for the exam, diagnosis, procedures, and orders are all accurate and complete.    Signed, Rip Harbour, NP [place] [phone]

## 2021-05-14 ENCOUNTER — Other Ambulatory Visit: Payer: Self-pay

## 2021-05-14 DIAGNOSIS — E782 Mixed hyperlipidemia: Secondary | ICD-10-CM

## 2021-05-14 DIAGNOSIS — K219 Gastro-esophageal reflux disease without esophagitis: Secondary | ICD-10-CM

## 2021-05-14 MED ORDER — ROSUVASTATIN CALCIUM 5 MG PO TABS: ORAL_TABLET | ORAL | 1 refills | Status: DC

## 2021-05-14 MED ORDER — FAMOTIDINE 40 MG PO TABS: 40.0000 mg | ORAL_TABLET | Freq: Every day | ORAL | 1 refills | Status: DC

## 2021-05-18 HISTORY — PX: JOINT REPLACEMENT: SHX530

## 2021-05-28 ENCOUNTER — Ambulatory Visit (INDEPENDENT_AMBULATORY_CARE_PROVIDER_SITE_OTHER): Payer: Medicare PPO

## 2021-05-28 DIAGNOSIS — H6123 Impacted cerumen, bilateral: Secondary | ICD-10-CM

## 2021-05-28 NOTE — Progress Notes (Signed)
Patient in office for bilateral irrigation. Pt tolerated and was successful.   Lorita Officer, CCMA 05/28/21 9:50 AM

## 2021-06-05 ENCOUNTER — Other Ambulatory Visit: Payer: Self-pay

## 2021-06-05 DIAGNOSIS — K219 Gastro-esophageal reflux disease without esophagitis: Secondary | ICD-10-CM

## 2021-06-05 DIAGNOSIS — I10 Essential (primary) hypertension: Secondary | ICD-10-CM

## 2021-06-06 MED ORDER — PANTOPRAZOLE SODIUM 40 MG PO TBEC: 40.0000 mg | DELAYED_RELEASE_TABLET | Freq: Every day | ORAL | 1 refills | Status: DC

## 2021-06-06 MED ORDER — MELOXICAM 15 MG PO TABS: 15.0000 mg | ORAL_TABLET | Freq: Every day | ORAL | 1 refills | Status: DC

## 2021-06-06 MED ORDER — TELMISARTAN 40 MG PO TABS: 40.0000 mg | ORAL_TABLET | Freq: Every day | ORAL | 1 refills | Status: DC

## 2021-06-20 NOTE — Progress Notes (Signed)
Subjective:  Patient ID: Melinda Miller, female    DOB: 1950/10/16  Age: 71 y.o. MRN: [id number]  Chief Complaint  Patient presents with   Hyperlipidemia   Hypertension    Hyperlipidemia: Current medications: Rosuvastatin 5mg  take 1 tablet daily.  Hypertension: Current medications: Telmisartan 40mg  take 1 tablet daily. Awoke in the middle of the night she noticed her heart rate was racing. Lasted less than 5 minutes. Happened one time previously about 2-3 months ago.  Denies excessive caffeine intake or decongestant use.   GERD: Current medications: Pantoprazole 40mg  take 1 tablet by mouth daily. Pepcid 40 mg once daily  Hand OA and hip OA: meloxicam 15 mg daily. Occasionally has to take tylenol which helps.    Current Outpatient Medications on File Prior to Visit  Medication Sig Dispense Refill   augmented betamethasone dipropionate (DIPROLENE-AF) 0.05 % cream APPLY TO AFFECTED AREA TWICE A DAY 50 g 1   Calcium Carbonate-Vit D-Min (CALCIUM 1200 PO) Take 1,200 mg by mouth in the morning and at bedtime.     famotidine (PEPCID) 40 MG tablet Take 1 tablet (40 mg total) by mouth at bedtime. 90 tablet 1   fluticasone (FLONASE) 50 MCG/ACT nasal spray Place 2 sprays into both nostrils daily. 16 g 6   meloxicam (MOBIC) 15 MG tablet Take 1 tablet (15 mg total) by mouth daily. 90 tablet 1   pantoprazole (PROTONIX) 40 MG tablet Take 1 tablet (40 mg total) by mouth daily. 90 tablet 1   rosuvastatin (CRESTOR) 5 MG tablet TAKE ONE (1) TABLET ONCE DAILY 90 tablet 1   telmisartan (MICARDIS) 40 MG tablet Take 1 tablet (40 mg total) by mouth daily. 90 tablet 1   No current facility-administered medications on file prior to visit.   Past Medical History:  Diagnosis Date   Essential hypertension    GERD (gastroesophageal reflux disease)    Mixed hyperlipidemia    Past Surgical History:  Procedure Laterality Date   ABDOMINAL HYSTERECTOMY      Family History  Problem Relation Age of Onset    Alzheimer's disease Mother    CAD Father    Heart attack Father    Cancer Sister        kidney   Alzheimer's disease Sister    Social History   Socioeconomic History   Marital status: Married    Spouse name: Takyra Bleazard   Number of children: 2   Years of education: Not on file   Highest education level: Not on file  Occupational History   Not on file  Tobacco Use   Smoking status: Never   Smokeless tobacco: Never  Substance and Sexual Activity   Alcohol use: Yes    Alcohol/week: 1.0 standard drink    Types: 1 Cans of beer per week   Drug use: Never   Sexual activity: Not on file  Other Topics Concern   Not on file  Social History Narrative   Not on file   Social Determinants of Health   Financial Resource Strain: Not on file  Food Insecurity: No Food Insecurity   Worried About Running Out of Food in the Last Year: Never true   Ran Out of Food in the Last Year: Never true  Transportation Needs: No Transportation Needs   Lack of Transportation (Medical): No   Lack of Transportation (Non-Medical): No  Physical Activity: Insufficiently Active   Days of Exercise per Week: 4 days   Minutes of Exercise per Session: 30 min  Stress: Not on file  Social Connections: Not on file    Review of Systems  Constitutional:  Negative for appetite change, chills, fatigue and fever.  HENT:  Negative for congestion, ear pain, rhinorrhea, sinus pressure and sore throat.   Eyes:  Negative for pain.  Respiratory:  Negative for cough, chest tightness, shortness of breath and wheezing.   Cardiovascular:  Positive for palpitations. Negative for chest pain.  Gastrointestinal:  Negative for abdominal pain, constipation, diarrhea, nausea and vomiting.  Genitourinary:  Negative for dysuria, hematuria and urgency.  Musculoskeletal:  Negative for arthralgias (bilateral hip pain (R>L)), back pain, joint swelling and myalgias.  Skin:  Negative for rash.  Neurological:  Negative for  dizziness, weakness, light-headedness and headaches.  Psychiatric/Behavioral:  Negative for dysphoric mood. The patient is not nervous/anxious.     Objective:  BP 122/72    Pulse 82    Temp (!) 97.1 F (36.2 C)    Resp 16    Wt 145 lb (65.8 kg)    SpO2 100%    BMI 24.89 kg/m   BP/Weight 06/23/2021 04/29/2021 03/31/2021  Systolic BP 122 118 140  Diastolic BP 72 64 82  Wt. (Lbs) 145 144 148.2  BMI 24.89 24.72 25.44    Physical Exam Vitals reviewed.  Constitutional:      Appearance: Normal appearance. She is normal weight.  Neck:     Vascular: No carotid bruit.  Cardiovascular:     Rate and Rhythm: Normal rate and regular rhythm.     Heart sounds: Normal heart sounds.  Pulmonary:     Effort: Pulmonary effort is normal. No respiratory distress.     Breath sounds: Normal breath sounds.  Abdominal:     General: Abdomen is flat. Bowel sounds are normal.     Palpations: Abdomen is soft.     Tenderness: There is no abdominal tenderness.  Musculoskeletal:        General: Tenderness (rt inguinal region. Discomfort with flexion of rt hip. Left hip ROM is normal without discomfort.) present.  Lymphadenopathy:     Lower Body: No right inguinal adenopathy.  Neurological:     Mental Status: She is alert and oriented to person, place, and time.  Psychiatric:        Mood and Affect: Mood normal.        Behavior: Behavior normal.       Lab Results  Component Value Date   WBC 5.9 02/17/2021   HGB 12.5 02/17/2021   HCT 37.3 02/17/2021   PLT 200 02/17/2021   GLUCOSE 105 (H) 02/17/2021   CHOL 156 02/17/2021   TRIG 101 02/17/2021   HDL 51 02/17/2021   LDLCALC 86 02/17/2021   ALT 15 02/17/2021   AST 20 02/17/2021   NA 142 02/17/2021   K 4.6 02/17/2021   CL 104 02/17/2021   CREATININE 1.06 (H) 02/17/2021   BUN 15 02/17/2021   CO2 22 02/17/2021   TSH 2.430 11/13/2020      Assessment & Plan:   Problem List Items Addressed This Visit       Cardiovascular and Mediastinum    Essential hypertension, benign - Primary    Well controlled.  No changes to medicines.  Continue to work on eating a healthy diet and exercise.  Labs drawn today.        Relevant Orders   CBC With Diff/Platelet   Comprehensive metabolic panel     Digestive   GERD without esophagitis    The current  medical regimen is effective;  continue present plan and medications.  Continue Pantoprazole 40mg  take 1 tablet by mouth daily. Pepcid 40 mg once daily        Musculoskeletal and Integument   Osteoarthritis of right hip     Other   Mixed hyperlipidemia    The current medical regimen is effective;  continue present plan and medications. Continue rosuvastatin 5 mg daily.       Relevant Orders   Lipid panel   Palpitations    Avoid caffeine and decongestants.  Reassurance given as patient was asymptomac with brief increased heart rate. If recurs and/or is more frequent, recommend call and I will order a holter monitor EKG: NSR. No ST changes.       Relevant Orders   EKG 12-Lead   TSH  .  No orders of the defined types were placed in this encounter.   Orders Placed This Encounter  Procedures   CBC With Diff/Platelet   Comprehensive metabolic panel   Lipid panel   TSH   EKG 12-Lead     Follow-up: Return in about 4 months (around 10/21/2021) for chronic fasting.  An After Visit Summary was printed and given to the patient.  Rochel Brome, MD [facility] [phone]

## 2021-06-23 ENCOUNTER — Encounter: Payer: Self-pay | Admitting: Family Medicine

## 2021-06-23 ENCOUNTER — Other Ambulatory Visit: Payer: Self-pay

## 2021-06-23 ENCOUNTER — Ambulatory Visit: Payer: Medicare PPO | Admitting: Family Medicine

## 2021-06-23 VITALS — BP 122/72 | HR 82 | Temp 97.1°F | Resp 16 | Wt 145.0 lb

## 2021-06-23 DIAGNOSIS — K219 Gastro-esophageal reflux disease without esophagitis: Secondary | ICD-10-CM

## 2021-06-23 DIAGNOSIS — I10 Essential (primary) hypertension: Secondary | ICD-10-CM

## 2021-06-23 DIAGNOSIS — M1611 Unilateral primary osteoarthritis, right hip: Secondary | ICD-10-CM

## 2021-06-23 DIAGNOSIS — R002 Palpitations: Secondary | ICD-10-CM

## 2021-06-23 DIAGNOSIS — E782 Mixed hyperlipidemia: Secondary | ICD-10-CM | POA: Diagnosis not present

## 2021-06-23 NOTE — Assessment & Plan Note (Signed)
Well controlled.  ?No changes to medicines.  ?Continue to work on eating a healthy diet and exercise.  ?Labs drawn today.  ?

## 2021-06-23 NOTE — Assessment & Plan Note (Signed)
The current medical regimen is effective;  continue present plan and medications. Continue rosuvastatin 5 mg daily.

## 2021-06-23 NOTE — Assessment & Plan Note (Signed)
Avoid caffeine and decongestants.  Reassurance given as patient was asymptomac with brief increased heart rate. If recurs and/or is more frequent, recommend call and I will order a holter monitor EKG: NSR. No ST changes.

## 2021-06-23 NOTE — Assessment & Plan Note (Signed)
The current medical regimen is effective;  continue present plan and medications.  Continue Pantoprazole 40mg  take 1 tablet by mouth daily. Pepcid 40 mg once daily

## 2021-06-24 LAB — LIPID PANEL
Chol/HDL Ratio: 2.9 ratio (ref 0.0–4.4)
Cholesterol, Total: 165 mg/dL (ref 100–199)
HDL: 56 mg/dL (ref >39)
LDL Chol Calc (NIH): 92 mg/dL (ref 0–99)
Triglycerides: 90 mg/dL (ref 0–149)
VLDL Cholesterol Cal: 17 mg/dL (ref 5–40)

## 2021-06-24 LAB — COMPREHENSIVE METABOLIC PANEL
ALT: 11 IU/L (ref 0–32)
AST: 18 IU/L (ref 0–40)
Albumin/Globulin Ratio: 1.7 (ref 1.2–2.2)
Albumin: 4.4 g/dL (ref 3.8–4.8)
Alkaline Phosphatase: 93 IU/L (ref 44–121)
BUN/Creatinine Ratio: 24 (ref 12–28)
BUN: 27 mg/dL (ref 8–27)
Bilirubin Total: 0.3 mg/dL (ref 0.0–1.2)
CO2: 25 mmol/L (ref 20–29)
Calcium: 9.6 mg/dL (ref 8.7–10.3)
Chloride: 102 mmol/L (ref 96–106)
Creatinine, Ser: 1.13 mg/dL — ABNORMAL HIGH (ref 0.57–1.00)
Globulin, Total: 2.6 g/dL (ref 1.5–4.5)
Glucose: 110 mg/dL — ABNORMAL HIGH (ref 70–99)
Potassium: 4.6 mmol/L (ref 3.5–5.2)
Sodium: 139 mmol/L (ref 134–144)
Total Protein: 7 g/dL (ref 6.0–8.5)
eGFR: 53 mL/min/{1.73_m2} — ABNORMAL LOW (ref >59)

## 2021-06-24 LAB — CBC WITH DIFF/PLATELET
Basophils Absolute: 0 10*3/uL (ref 0.0–0.2)
Basos: 1 %
EOS (ABSOLUTE): 0.1 10*3/uL (ref 0.0–0.4)
Eos: 2 %
Hematocrit: 35.8 % (ref 34.0–46.6)
Hemoglobin: 12.1 g/dL (ref 11.1–15.9)
Immature Grans (Abs): 0 10*3/uL (ref 0.0–0.1)
Immature Granulocytes: 0 %
Lymphocytes Absolute: 2.1 10*3/uL (ref 0.7–3.1)
Lymphs: 38 %
MCH: 33 pg (ref 26.6–33.0)
MCHC: 33.8 g/dL (ref 31.5–35.7)
MCV: 98 fL — ABNORMAL HIGH (ref 79–97)
Monocytes Absolute: 0.8 10*3/uL (ref 0.1–0.9)
Monocytes: 14 %
Neutrophils Absolute: 2.4 10*3/uL (ref 1.4–7.0)
Neutrophils: 45 %
Platelets: 177 10*3/uL (ref 150–450)
RBC: 3.67 x10E6/uL — ABNORMAL LOW (ref 3.77–5.28)
RDW: 13.7 % (ref 11.7–15.4)
WBC: 5.4 10*3/uL (ref 3.4–10.8)

## 2021-06-24 LAB — CARDIOVASCULAR RISK ASSESSMENT

## 2021-06-24 LAB — TSH: TSH: 2.34 u[IU]/mL (ref 0.450–4.500)

## 2021-06-24 NOTE — Progress Notes (Signed)
Blood count normal.  Liver function normal.  Kidney function abnormal.  Recommend stop meloxicam. Recheck in 1 month. Thyroid function normal.  Cholesterol: good HBA1C:

## 2021-07-04 ENCOUNTER — Ambulatory Visit: Payer: Medicare PPO | Admitting: Legal Medicine

## 2021-07-04 ENCOUNTER — Other Ambulatory Visit: Payer: Self-pay

## 2021-07-04 ENCOUNTER — Encounter: Payer: Self-pay | Admitting: Legal Medicine

## 2021-07-04 VITALS — BP 120/60 | HR 86 | Temp 98.9°F | Resp 16 | Ht 64.0 in | Wt 145.0 lb

## 2021-07-04 DIAGNOSIS — M47812 Spondylosis without myelopathy or radiculopathy, cervical region: Secondary | ICD-10-CM | POA: Diagnosis not present

## 2021-07-04 DIAGNOSIS — N1831 Chronic kidney disease, stage 3a: Secondary | ICD-10-CM

## 2021-07-04 MED ORDER — TRAMADOL HCL 50 MG PO TABS: 50.0000 mg | ORAL_TABLET | Freq: Three times a day (TID) | ORAL | 0 refills | Status: AC | PRN

## 2021-07-04 NOTE — Progress Notes (Signed)
Acute Office Visit  Subjective:    Patient ID: Melinda Miller, female    DOB: 1950-09-15, 71 y.o.   MRN: [id number]  Chief Complaint  Patient presents with   Neck Pain    HPI: Patient is in today for neck pain on left sided with radiation to left scapula. She mentioned that she started 7 days ago. Her NSAID was stopped due to changes in renal tests.  She is using some tylenol.  No radicular pain and no injuries.  No former x-rays  Past Medical History:  Diagnosis Date   Essential hypertension    GERD (gastroesophageal reflux disease)    Mixed hyperlipidemia     Past Surgical History:  Procedure Laterality Date   ABDOMINAL HYSTERECTOMY      Family History  Problem Relation Age of Onset   Alzheimer's disease Mother    CAD Father    Heart attack Father    Cancer Sister        kidney   Alzheimer's disease Sister     Social History   Socioeconomic History   Marital status: Married    Spouse name: Ariatna Jester   Number of children: 2   Years of education: Not on file   Highest education level: Not on file  Occupational History   Not on file  Tobacco Use   Smoking status: Never   Smokeless tobacco: Never  Substance and Sexual Activity   Alcohol use: Yes    Alcohol/week: 1.0 standard drink    Types: 1 Cans of beer per week   Drug use: Never   Sexual activity: Not on file  Other Topics Concern   Not on file  Social History Narrative   Not on file   Social Determinants of Health   Financial Resource Strain: Not on file  Food Insecurity: No Food Insecurity   Worried About Running Out of Food in the Last Year: Never true   Ran Out of Food in the Last Year: Never true  Transportation Needs: No Transportation Needs   Lack of Transportation (Medical): No   Lack of Transportation (Non-Medical): No  Physical Activity: Insufficiently Active   Days of Exercise per Week: 4 days   Minutes of Exercise per Session: 30 min  Stress: Not on file  Social  Connections: Not on file  Intimate Partner Violence: Not At Risk   Fear of Current or Ex-Partner: No   Emotionally Abused: No   Physically Abused: No   Sexually Abused: No    Outpatient Medications Prior to Visit  Medication Sig Dispense Refill   augmented betamethasone dipropionate (DIPROLENE-AF) 0.05 % cream APPLY TO AFFECTED AREA TWICE A DAY 50 g 1   Calcium Carbonate-Vit D-Min (CALCIUM 1200 PO) Take 1,200 mg by mouth in the morning and at bedtime.     famotidine (PEPCID) 40 MG tablet Take 1 tablet (40 mg total) by mouth at bedtime. 90 tablet 1   fluticasone (FLONASE) 50 MCG/ACT nasal spray Place 2 sprays into both nostrils daily. 16 g 6   pantoprazole (PROTONIX) 40 MG tablet Take 1 tablet (40 mg total) by mouth daily. 90 tablet 1   rosuvastatin (CRESTOR) 5 MG tablet TAKE ONE (1) TABLET ONCE DAILY 90 tablet 1   telmisartan (MICARDIS) 40 MG tablet Take 1 tablet (40 mg total) by mouth daily. 90 tablet 1   meloxicam (MOBIC) 15 MG tablet Take 1 tablet (15 mg total) by mouth daily. 90 tablet 1   No facility-administered medications prior to  visit.    Allergies  Allergen Reactions   Sulfa Antibiotics Hives    Review of Systems  Constitutional:  Negative for chills, fatigue and fever.  HENT:  Negative for congestion, ear pain and sore throat.   Respiratory:  Negative for cough and shortness of breath.   Cardiovascular:  Negative for chest pain and palpitations.  Gastrointestinal:  Negative for abdominal pain, constipation, diarrhea, nausea and vomiting.  Endocrine: Negative for polydipsia, polyphagia and polyuria.  Genitourinary:  Negative for difficulty urinating and dysuria.  Musculoskeletal:  Positive for neck pain (Left sided of the neck). Negative for arthralgias, back pain and myalgias.  Skin:  Negative for rash.  Neurological:  Negative for headaches.  Psychiatric/Behavioral:  Negative for dysphoric mood. The patient is not nervous/anxious.       Objective:    Physical  Exam Vitals reviewed.  Constitutional:      General: She is in acute distress.     Appearance: Normal appearance.  HENT:     Right Ear: Tympanic membrane normal.     Left Ear: Tympanic membrane normal.     Mouth/Throat:     Pharynx: Oropharynx is clear.  Eyes:     Extraocular Movements: Extraocular movements intact.     Conjunctiva/sclera: Conjunctivae normal.     Pupils: Pupils are equal, round, and reactive to light.  Neck:      Comments: Flexion 60 degrees, right rotation can touch shoulder, left rotation 20 degrees, pain on any extension 10 degrees, negative [place] Cardiovascular:     Rate and Rhythm: Regular rhythm.     Pulses: Normal pulses.     Heart sounds: Normal heart sounds. No murmur heard.   No gallop.  Pulmonary:     Effort: Pulmonary effort is normal. No respiratory distress.     Breath sounds: Normal breath sounds.  Abdominal:     General: Abdomen is flat. There is no distension.     Tenderness: There is no abdominal tenderness.  Musculoskeletal:     Cervical back: Tenderness present. No rigidity.  Skin:    General: Skin is warm.     Capillary Refill: Capillary refill takes less than 2 seconds.  Neurological:     General: No focal deficit present.     Mental Status: She is alert. Mental status is at baseline.     Sensory: No sensory deficit.     Motor: No weakness.    BP 120/60    Pulse 86    Temp 98.9 F (37.2 C)    Resp 16    Ht '5\' 4"'  (1.626 m)    Wt 145 lb (65.8 kg)    SpO2 97%    BMI 24.89 kg/m  Wt Readings from Last 3 Encounters:  07/04/21 145 lb (65.8 kg)  06/23/21 145 lb (65.8 kg)  04/29/21 144 lb (65.3 kg)    Health Maintenance Due  Topic Date Due   COLONOSCOPY (Pts 45-61yr Insurance coverage will need to be confirmed)  Never done   Zoster Vaccines- Shingrix (1 of 2) Never done   DEXA SCAN  Never done   TETANUS/TDAP  02/16/2021    There are no preventive care reminders to display for this patient.   Lab Results  Component Value  Date   TSH 2.340 06/23/2021   Lab Results  Component Value Date   WBC 5.4 06/23/2021   HGB 12.1 06/23/2021   HCT 35.8 06/23/2021   MCV 98 (H) 06/23/2021   PLT 177 06/23/2021   Lab  Results  Component Value Date   NA 139 06/23/2021   K 4.6 06/23/2021   CO2 25 06/23/2021   GLUCOSE 110 (H) 06/23/2021   BUN 27 06/23/2021   CREATININE 1.13 (H) 06/23/2021   BILITOT 0.3 06/23/2021   ALKPHOS 93 06/23/2021   AST 18 06/23/2021   ALT 11 06/23/2021   PROT 7.0 06/23/2021   ALBUMIN 4.4 06/23/2021   CALCIUM 9.6 06/23/2021   EGFR 53 (L) 06/23/2021   Lab Results  Component Value Date   CHOL 165 06/23/2021   Lab Results  Component Value Date   HDL 56 06/23/2021   Lab Results  Component Value Date   LDLCALC 92 06/23/2021   Lab Results  Component Value Date   TRIG 90 06/23/2021   Lab Results  Component Value Date   CHOLHDL 2.9 06/23/2021   No results found for: HGBA1C     Assessment & Plan:   Problem List Items Addressed This Visit   None Visit Diagnoses     Stage 3a chronic kidney disease ([place])    -  Primary Patient's kidney is stable    Cervical spondylosis       Relevant Medications   traMADol (ULTRAM) 50 MG tablet Patient having neck pain without radiculopathy, home exercise      Meds ordered this encounter  Medications   traMADol (ULTRAM) 50 MG tablet    Sig: Take 1 tablet (50 mg total) by mouth every 8 (eight) hours as needed for up to 5 days.    Dispense:  15 tablet    Refill:  0       Follow-up: Return in about 3 months (around 10/01/2021).  An After Visit Summary was printed and given to the patient.  Reinaldo Meeker, MD [facility] [phone]

## 2021-07-06 ENCOUNTER — Encounter: Payer: Self-pay | Admitting: Legal Medicine

## 2021-07-17 DIAGNOSIS — H43812 Vitreous degeneration, left eye: Secondary | ICD-10-CM | POA: Diagnosis not present

## 2021-07-22 ENCOUNTER — Other Ambulatory Visit: Payer: Self-pay

## 2021-07-22 ENCOUNTER — Other Ambulatory Visit: Payer: Medicare PPO

## 2021-07-22 DIAGNOSIS — N289 Disorder of kidney and ureter, unspecified: Secondary | ICD-10-CM | POA: Diagnosis not present

## 2021-07-22 LAB — COMPREHENSIVE METABOLIC PANEL
ALT: 12 IU/L (ref 0–32)
AST: 17 IU/L (ref 0–40)
Albumin/Globulin Ratio: 1.5 (ref 1.2–2.2)
Albumin: 4.2 g/dL (ref 3.8–4.8)
Alkaline Phosphatase: 91 IU/L (ref 44–121)
BUN/Creatinine Ratio: 21 (ref 12–28)
BUN: 21 mg/dL (ref 8–27)
Bilirubin Total: 0.5 mg/dL (ref 0.0–1.2)
CO2: 23 mmol/L (ref 20–29)
Calcium: 9.6 mg/dL (ref 8.7–10.3)
Chloride: 102 mmol/L (ref 96–106)
Creatinine, Ser: 1 mg/dL (ref 0.57–1.00)
Globulin, Total: 2.8 g/dL (ref 1.5–4.5)
Glucose: 103 mg/dL — ABNORMAL HIGH (ref 70–99)
Potassium: 4.4 mmol/L (ref 3.5–5.2)
Sodium: 140 mmol/L (ref 134–144)
Total Protein: 7 g/dL (ref 6.0–8.5)
eGFR: 61 mL/min/{1.73_m2} (ref >59)

## 2021-07-25 ENCOUNTER — Other Ambulatory Visit: Payer: Self-pay

## 2021-07-25 MED ORDER — MELOXICAM 7.5 MG PO TABS: 7.5000 mg | ORAL_TABLET | Freq: Every day | ORAL | 0 refills | Status: DC

## 2021-07-28 ENCOUNTER — Ambulatory Visit: Payer: Medicare PPO | Admitting: Family Medicine

## 2021-07-28 ENCOUNTER — Other Ambulatory Visit: Payer: Self-pay

## 2021-07-28 VITALS — BP 130/72 | HR 72 | Temp 97.1°F | Resp 14 | Ht 64.0 in | Wt 142.0 lb

## 2021-07-28 DIAGNOSIS — M542 Cervicalgia: Secondary | ICD-10-CM

## 2021-07-28 MED ORDER — PREDNISONE 50 MG PO TABS
50.0000 mg | ORAL_TABLET | Freq: Every day | ORAL | 0 refills | Status: DC
Start: 2021-07-28 — End: 2021-08-11

## 2021-07-28 NOTE — Progress Notes (Unsigned)
Acute Office Visit  Subjective:    Patient ID: Melinda Miller, female    DOB: [birth year], 71 y.o.   MRN: [id number]  Chief Complaint  Patient presents with   Neck pain    HPI Patient is in today for left neck pain and right hip radiating down to medial side of leg. Patient was on meloxicam for years and I discontinue due to increasing GFR. She stopped it and labs returned to normal. Interestingly, patient's bp improved off meloxicam. Patient tried Acetaminophen arthritis taken over the weekend and bp increased to 175/83. It did help her pain. Has used voltaren gel and this has helped.   Past Medical History:  Diagnosis Date   Essential hypertension    GERD (gastroesophageal reflux disease)    Mixed hyperlipidemia     Past Surgical History:  Procedure Laterality Date   ABDOMINAL HYSTERECTOMY      Family History  Problem Relation Age of Onset   Alzheimer's disease Mother    CAD Father    Heart attack Father    Cancer Sister        kidney   Alzheimer's disease Sister     Social History   Socioeconomic History   Marital status: Married    Spouse name: Olukemi Panchal   Number of children: 2   Years of education: Not on file   Highest education level: Not on file  Occupational History   Not on file  Tobacco Use   Smoking status: Never   Smokeless tobacco: Never  Substance and Sexual Activity   Alcohol use: Yes    Alcohol/week: 1.0 standard drink    Types: 1 Cans of beer per week   Drug use: Never   Sexual activity: Not on file  Other Topics Concern   Not on file  Social History Narrative   Not on file   Social Determinants of Health   Financial Resource Strain: Not on file  Food Insecurity: No Food Insecurity   Worried About Running Out of Food in the Last Year: Never true   Ran Out of Food in the Last Year: Never true  Transportation Needs: No Transportation Needs   Lack of Transportation (Medical): No   Lack of Transportation (Non-Medical): No   Physical Activity: Insufficiently Active   Days of Exercise per Week: 4 days   Minutes of Exercise per Session: 30 min  Stress: Not on file  Social Connections: Not on file  Intimate Partner Violence: Not At Risk   Fear of Current or Ex-Partner: No   Emotionally Abused: No   Physically Abused: No   Sexually Abused: No    Outpatient Medications Prior to Visit  Medication Sig Dispense Refill   augmented betamethasone dipropionate (DIPROLENE-AF) 0.05 % cream APPLY TO AFFECTED AREA TWICE A DAY 50 g 1   Calcium Carbonate-Vit D-Min (CALCIUM 1200 PO) Take 1,200 mg by mouth in the morning and at bedtime.     famotidine (PEPCID) 40 MG tablet Take 1 tablet (40 mg total) by mouth at bedtime. 90 tablet 1   fluticasone (FLONASE) 50 MCG/ACT nasal spray Place 2 sprays into both nostrils daily. 16 g 6   meloxicam (MOBIC) 7.5 MG tablet Take 1 tablet (7.5 mg total) by mouth daily. 30 tablet 0   pantoprazole (PROTONIX) 40 MG tablet Take 1 tablet (40 mg total) by mouth daily. 90 tablet 1   rosuvastatin (CRESTOR) 5 MG tablet TAKE ONE (1) TABLET ONCE DAILY 90 tablet 1   telmisartan (MICARDIS)  40 MG tablet Take 1 tablet (40 mg total) by mouth daily. 90 tablet 1   No facility-administered medications prior to visit.    Allergies  Allergen Reactions   Sulfa Antibiotics Hives    Review of Systems  Constitutional:  Negative for appetite change, fatigue and fever.  HENT:  Negative for congestion, ear pain, sinus pressure and sore throat.   Respiratory:  Negative for cough, chest tightness, shortness of breath and wheezing.   Cardiovascular:  Negative for chest pain and palpitations.  Gastrointestinal:  Negative for abdominal pain, constipation, diarrhea, nausea and vomiting.  Genitourinary:  Negative for dysuria and hematuria.  Musculoskeletal:  Positive for arthralgias (diffuse pain) and neck pain. Negative for back pain, joint swelling and myalgias.  Skin:  Negative for rash.  Neurological:  Negative  for dizziness, weakness and headaches.  Psychiatric/Behavioral:  Negative for dysphoric mood. The patient is not nervous/anxious.       Objective:    Physical Exam Vitals reviewed.  Constitutional:      Appearance: Normal appearance. She is normal weight.  Neck:     Vascular: No carotid bruit.  Cardiovascular:     Rate and Rhythm: Normal rate and regular rhythm.     Heart sounds: Normal heart sounds.  Pulmonary:     Effort: Pulmonary effort is normal. No respiratory distress.     Breath sounds: Normal breath sounds.  Abdominal:     General: Abdomen is flat. Bowel sounds are normal.     Palpations: Abdomen is soft.     Tenderness: There is no abdominal tenderness.  Neurological:     Mental Status: She is alert and oriented to person, place, and time.  Psychiatric:        Mood and Affect: Mood normal.        Behavior: Behavior normal.    There were no vitals taken for this visit. Wt Readings from Last 3 Encounters:  07/04/21 145 lb (65.8 kg)  06/23/21 145 lb (65.8 kg)  04/29/21 144 lb (65.3 kg)    Health Maintenance Due  Topic Date Due   COLONOSCOPY (Pts 45-48yr Insurance coverage will need to be confirmed)  Never done   Zoster Vaccines- Shingrix (1 of 2) Never done   DEXA SCAN  Never done   TETANUS/TDAP  02/16/2021    There are no preventive care reminders to display for this patient.   Lab Results  Component Value Date   TSH 2.340 06/23/2021   Lab Results  Component Value Date   WBC 5.4 06/23/2021   HGB 12.1 06/23/2021   HCT 35.8 06/23/2021   MCV 98 (H) 06/23/2021   PLT 177 06/23/2021   Lab Results  Component Value Date   NA 140 07/22/2021   K 4.4 07/22/2021   CO2 23 07/22/2021   GLUCOSE 103 (H) 07/22/2021   BUN 21 07/22/2021   CREATININE 1.00 07/22/2021   BILITOT 0.5 07/22/2021   ALKPHOS 91 07/22/2021   AST 17 07/22/2021   ALT 12 07/22/2021   PROT 7.0 07/22/2021   ALBUMIN 4.2 07/22/2021   CALCIUM 9.6 07/22/2021   EGFR 61 07/22/2021   Lab  Results  Component Value Date   CHOL 165 06/23/2021   Lab Results  Component Value Date   HDL 56 06/23/2021   Lab Results  Component Value Date   LDLCALC 92 06/23/2021   Lab Results  Component Value Date   TRIG 90 06/23/2021   Lab Results  Component Value Date   CHOLHDL 2.9  06/23/2021   No results found for: HGBA1C       Assessment & Plan:   There are no diagnoses linked to this encounter.   No orders of the defined types were placed in this encounter.    Oleta Mouse, CMA

## 2021-07-28 NOTE — Patient Instructions (Signed)
Prednisone 50 mg once daily x 5 days. May use voltaren gel. No nsaids (ibuprofen, aleve, meloxicam.) May use tylenol, but recommend 1/2 pill every 6 hrs as needed.  ?Neck pain: recommend exercises. ?
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
## 2021-08-12 ENCOUNTER — Ambulatory Visit: Payer: Medicare PPO | Admitting: Nurse Practitioner

## 2021-08-12 ENCOUNTER — Encounter: Payer: Self-pay | Admitting: Nurse Practitioner

## 2021-08-12 ENCOUNTER — Other Ambulatory Visit: Payer: Self-pay

## 2021-08-12 VITALS — BP 122/80 | HR 82 | Temp 96.0°F | Ht 64.0 in | Wt 141.0 lb

## 2021-08-12 DIAGNOSIS — M25551 Pain in right hip: Secondary | ICD-10-CM | POA: Diagnosis not present

## 2021-08-12 DIAGNOSIS — M1611 Unilateral primary osteoarthritis, right hip: Secondary | ICD-10-CM | POA: Diagnosis not present

## 2021-08-12 DIAGNOSIS — T23051A Burn of unspecified degree of right palm, initial encounter: Secondary | ICD-10-CM | POA: Diagnosis not present

## 2021-08-12 DIAGNOSIS — M47812 Spondylosis without myelopathy or radiculopathy, cervical region: Secondary | ICD-10-CM | POA: Diagnosis not present

## 2021-08-12 MED ORDER — DICLOFENAC SODIUM 1 % EX GEL: 4.0000 g | Freq: Four times a day (QID) | CUTANEOUS | 2 refills | Status: DC

## 2021-08-12 NOTE — Patient Instructions (Addendum)
Continue Voltaren gel up to 4 times daily ?Continue Tylenol arthritis as directed ?Continue neck exercises and right hip exercises ?Alternate heat and ice to right hip as needed ?We will call you with appointment with Dr Yevette Edwards ?Follow-up with Dr Sedalia Muta on 10/27/21 at 8:20, fasting ? ? ? ?Neck Exercises ?Ask your health care provider which exercises are safe for you. Do exercises exactly as told by your health care provider and adjust them as directed. It is normal to feel mild stretching, pulling, tightness, or discomfort as you do these exercises. Stop right away if you feel sudden pain or your pain gets worse. Do not begin these exercises until told by your health care provider. ?Neck exercises can be important for many reasons. They can improve strength and maintain flexibility in your neck, which will help your upper back and prevent neck pain. ?Stretching exercises ?Rotation neck stretching ? ?Sit in a chair or stand up. ?Place your feet flat on the floor, shoulder-width apart. ?Slowly turn your head (rotate) to the right until a slight stretch is felt. Turn it all the way to the right so you can look over your right shoulder. Do not tilt or tip your head. ?Hold this position for 10-30 seconds. ?Slowly turn your head (rotate) to the left until a slight stretch is felt. Turn it all the way to the left so you can look over your left shoulder. Do not tilt or tip your head. ?Hold this position for 10-30 seconds. ?Repeat __________ times. Complete this exercise __________ times a day. ?Neck retraction ? ?Sit in a sturdy chair or stand up. ?Look straight ahead. Do not bend your neck. ?Use your fingers to push your chin backward (retraction). Do not bend your neck for this movement. Continue to face straight ahead. If you are doing the exercise properly, you will feel a slight sensation in your throat and a stretch at the back of your neck. ?Hold the stretch for 1-2 seconds. ?Repeat __________ times. Complete this  exercise __________ times a day. ?Strengthening exercises ?Neck press ? ?Lie on your back on a firm bed or on the floor with a pillow under your head. ?Use your neck muscles to push your head down on the pillow and straighten your spine. ?Hold the position as well as you can. Keep your head facing up (in a neutral position) and your chin tucked. ?Slowly count to 5 while holding this position. ?Repeat __________ times. Complete this exercise __________ times a day. ?Isometrics ?These are exercises in which you strengthen the muscles in your neck while keeping your neck still (isometrics). ?Sit in a supportive chair and place your hand on your forehead. ?Keep your head and face facing straight ahead. Do not flex or extend your neck while doing isometrics. ?Push forward with your head and neck while pushing back with your hand. Hold for 10 seconds. ?Do the sequence again, this time putting your hand against the back of your head. Use your head and neck to push backward against the hand pressure. ?Finally, do the same exercise on either side of your head, pushing sideways against the pressure of your hand. ?Repeat __________ times. Complete this exercise __________ times a day. ?Prone head lifts ? ?Lie face-down (prone position), resting on your elbows so that your chest and upper back are raised. ?Start with your head facing downward, near your chest. Position your chin either on or near your chest. ?Slowly lift your head upward. Lift until you are looking straight ahead. Then  continue lifting your head as far back as you can comfortably stretch. ?Hold your head up for 5 seconds. Then slowly lower it to your starting position. ?Repeat __________ times. Complete this exercise __________ times a day. ?Supine head lifts ? ?Lie on your back (supine position), bending your knees to point to the ceiling and keeping your feet flat on the floor. ?Lift your head slowly off the floor, raising your chin toward your chest. ?Hold  for 5 seconds. ?Repeat __________ times. Complete this exercise __________ times a day. ?Scapular retraction ? ?Stand with your arms at your sides. Look straight ahead. ?Slowly pull both shoulders (scapulae) backward and downward (retraction) until you feel a stretch between your shoulder blades in your upper back. ?Hold for 10-30 seconds. ?Relax and repeat. ?Repeat __________ times. Complete this exercise __________ times a day. ?Contact a health care provider if: ?Your neck pain or discomfort gets worse when you do an exercise. ?Your neck pain or discomfort does not improve within 2 hours after you exercise. ?If you have any of these problems, stop exercising right away. Do not do the exercises again unless your health care provider says that you can. ?Get help right away if: ?You develop sudden, severe neck pain. ?If this happens, stop exercising right away. Do not do the exercises again unless your health care provider says that you can. ?This information is not intended to replace advice given to you by your health care provider. Make sure you discuss any questions you have with your health care provider. ?Document Revised: 10/29/2020 Document Reviewed: 10/29/2020 ?[employer]vier Patient Education ? 2022 [employer] ? ?Hip Exercises ?Ask your health care provider which exercises are safe for you. Do exercises exactly as told by your health care provider and adjust them as directed. It is normal to feel mild stretching, pulling, tightness, or discomfort as you do these exercises. Stop right away if you feel sudden pain or your pain gets worse. Do not begin these exercises until told by your health care provider. ?Stretching and range-of-motion exercises ?These exercises warm up your muscles and joints and improve the movement and flexibility of your hip. These exercises also help to relieve pain, numbness, and tingling. You may be asked to limit your range of motion if you had a hip replacement. Talk to your health  care provider about these restrictions. ?Hamstrings, supine ? ?Lie on your back (supine position). ?Loop a belt or towel over the ball of your left / right foot. The ball of your foot is on the walking surface, right under your toes. ?Straighten your left / right knee and slowly pull on the belt or towel to raise your leg until you feel a gentle stretch behind your knee (hamstring). ?Do not let your knee bend while you do this. ?Keep your other leg flat on the floor. ?Hold this position for __________ seconds. ?Slowly return your leg to the starting position. ?Repeat __________ times. Complete this exercise __________ times a day. ?Hip rotation ? ?Lie on your back on a firm surface. ?With your left / right hand, gently pull your left / right knee toward the shoulder that is on the same side of the body. Stop when your knee is pointing toward the ceiling. ?Hold your left / right ankle with your other hand. ?Keeping your knee steady, gently pull your left / right ankle toward your other shoulder until you feel a stretch in your buttocks. ?Keep your hips and shoulders firmly planted while you do this stretch. ?  Hold this position for __________ seconds. ?Repeat __________ times. Complete this exercise __________ times a day. ?Seated stretch ?This exercise is sometimes called hamstrings and adductors stretch. ?Sit on the floor with your legs stretched wide. Keep your knees straight during this exercise. ?Keeping your head and back in a straight line, bend at your waist to reach for your left foot (position A). You should feel a stretch in your right inner thigh (adductors). ?Hold this position for __________ seconds. Then slowly return to the upright position. ?Keeping your head and back in a straight line, bend at your waist to reach forward (position B). You should feel a stretch behind both of your thighs and knees (hamstrings). ?Hold this position for __________ seconds. Then slowly return to the upright  position. ?Keeping your head and back in a straight line, bend at your waist to reach for your right foot (position C). You should feel a stretch in your left inner thigh (adductors). ?Hold this position for ________

## 2021-08-15 ENCOUNTER — Other Ambulatory Visit: Payer: Self-pay | Admitting: Family Medicine

## 2021-08-15 DIAGNOSIS — E782 Mixed hyperlipidemia: Secondary | ICD-10-CM

## 2021-08-15 DIAGNOSIS — K219 Gastro-esophageal reflux disease without esophagitis: Secondary | ICD-10-CM

## 2021-08-17 ENCOUNTER — Encounter: Payer: Self-pay | Admitting: Family Medicine

## 2021-08-17 DIAGNOSIS — M542 Cervicalgia: Secondary | ICD-10-CM | POA: Insufficient documentation

## 2021-08-17 NOTE — Assessment & Plan Note (Signed)
Prednisone 50 mg once daily x 5 days. May use voltaren gel. No nsaids (ibuprofen, aleve, meloxicam.) May use tylenol, but recommend 1/2 pill every 6 hrs as needed.  ?Neck pain: recommend exercises. ?

## 2021-08-19 DIAGNOSIS — M1611 Unilateral primary osteoarthritis, right hip: Secondary | ICD-10-CM | POA: Diagnosis not present

## 2021-09-03 DIAGNOSIS — Z1231 Encounter for screening mammogram for malignant neoplasm of breast: Secondary | ICD-10-CM | POA: Diagnosis not present

## 2021-09-03 LAB — HM MAMMOGRAPHY

## 2021-10-01 DIAGNOSIS — M1611 Unilateral primary osteoarthritis, right hip: Secondary | ICD-10-CM | POA: Diagnosis not present

## 2021-10-25 NOTE — Progress Notes (Unsigned)
Subjective:  Patient ID: Melinda Miller, female    DOB: [birth year]  Age: 71 y.o. MRN: [id number]  Chief Complaint  Patient presents with   Hypertension   Hyperlipidemia    HPI Hyperlipidemia: Current medications: Rosuvastatin 5 mg daily.  Hypertension: Complications: Hyperlipidemia. Current medications: Telmisartan 40 mg daily.  GER: She takes Pantoprazole 40 mg daily, Famotidine 40 mg daily at bedtime.  Neck hurts on left side. Sometimes has a crick in neck. No numbness in arms. Has been going on since February 2023.  Right hip surgery scheduled 12/15/2021. Put back on meloxicam 7.5 mg once daily.   Eating healthy. Exercising: swimming twice a week.   HPI: updated and reviewed. Current Outpatient Medications on File Prior to Visit  Medication Sig Dispense Refill   augmented betamethasone dipropionate (DIPROLENE-AF) 0.05 % cream APPLY TO AFFECTED AREA TWICE A DAY 50 g 1   Calcium Carbonate-Vit D-Min (CALCIUM 1200 PO) Take 1,200 mg by mouth in the morning and at bedtime.     diclofenac Sodium (VOLTAREN) 1 % GEL Apply 4 g topically 4 (four) times daily. 350 g 2   famotidine (PEPCID) 40 MG tablet TAKE 1 TABLET BY MOUTH ONCE DAILY AT BEDTIME 90 tablet 1   fluticasone (FLONASE) 50 MCG/ACT nasal spray Place 2 sprays into both nostrils daily. 16 g 6   meloxicam (MOBIC) 7.5 MG tablet Take 7.5 mg by mouth daily.     pantoprazole (PROTONIX) 40 MG tablet Take 1 tablet (40 mg total) by mouth daily. 90 tablet 1   rosuvastatin (CRESTOR) 5 MG tablet TAKE 1 TABLET BY MOUTH ONCE DAILY 90 tablet 1   telmisartan (MICARDIS) 40 MG tablet Take 1 tablet (40 mg total) by mouth daily. 90 tablet 1   No current facility-administered medications on file prior to visit.   Past Medical History:  Diagnosis Date   Essential hypertension    GERD (gastroesophageal reflux disease)    Mixed hyperlipidemia    Past Surgical History:  Procedure Laterality Date   ABDOMINAL HYSTERECTOMY      Family  History  Problem Relation Age of Onset   Alzheimer's disease Mother    CAD Father    Heart attack Father    Cancer Sister        kidney   Alzheimer's disease Sister    Social History   Socioeconomic History   Marital status: Married    Spouse name: Kendrick RanchSteven Wynns   Number of children: 2   Years of education: Not on file   Highest education level: Not on file  Occupational History   Not on file  Tobacco Use   Smoking status: Never   Smokeless tobacco: Never  Substance and Sexual Activity   Alcohol use: Yes    Alcohol/week: 1.0 standard drink of alcohol    Types: 1 Cans of beer per week   Drug use: Never   Sexual activity: Not on file  Other Topics Concern   Not on file  Social History Narrative   Not on file   Social Determinants of Health   Financial Resource Strain: Not on file  Food Insecurity: No Food Insecurity (09/17/2020)   Hunger Vital Sign    Worried About Running Out of Food in the Last Year: Never true    Ran Out of Food in the Last Year: Never true  Transportation Needs: No Transportation Needs (09/17/2020)   PRAPARE - Administrator, Civil ServiceTransportation    Lack of Transportation (Medical): No    Lack of Transportation (Non-Medical):  No  Physical Activity: Insufficiently Active (09/17/2020)   Exercise Vital Sign    Days of Exercise per Week: 4 days    Minutes of Exercise per Session: 30 min  Stress: Not on file  Social Connections: Not on file    Review of Systems  Constitutional:  Negative for chills, fatigue and fever.  HENT:  Negative for congestion, ear pain and sore throat.        Ear wax  Respiratory:  Negative for cough and shortness of breath.   Cardiovascular:  Negative for chest pain and palpitations.  Gastrointestinal:  Negative for abdominal pain, constipation, diarrhea, nausea and vomiting.  Endocrine: Negative for polydipsia, polyphagia and polyuria.  Genitourinary:  Negative for difficulty urinating and dysuria.  Musculoskeletal:  Positive for arthralgias  (right hip pain) and neck pain. Negative for back pain and myalgias.  Skin:  Negative for rash.  Neurological:  Negative for headaches.  Psychiatric/Behavioral:  Negative for dysphoric mood. The patient is not nervous/anxious.      Objective:  BP 120/60   Pulse 78   Temp (!) 96.5 F (35.8 C)   Resp 14   Ht 5\' 4"  (1.626 m)   Wt 143 lb (64.9 kg)   BMI 24.55 kg/m      10/27/2021    8:13 AM 08/12/2021    7:58 AM 07/28/2021    3:41 PM  BP/Weight  Systolic BP 120 122 130  Diastolic BP 60 80 72  Wt. (Lbs) 143 141 142  BMI 24.55 kg/m2 24.2 kg/m2 24.37 kg/m2    Physical Exam Vitals reviewed.  Constitutional:      Appearance: Normal appearance. She is normal weight.  HENT:     Right Ear: There is impacted cerumen (partial).     Left Ear: There is impacted cerumen (partial).  Neck:     Vascular: No carotid bruit.  Cardiovascular:     Rate and Rhythm: Normal rate and regular rhythm.     Heart sounds: Normal heart sounds.  Pulmonary:     Effort: Pulmonary effort is normal. No respiratory distress.     Breath sounds: Normal breath sounds.  Abdominal:     General: Abdomen is flat. Bowel sounds are normal.     Palpations: Abdomen is soft.     Tenderness: There is no abdominal tenderness.  Neurological:     Mental Status: She is alert and oriented to person, place, and time.  Psychiatric:        Mood and Affect: Mood normal.        Behavior: Behavior normal.     Diabetic Foot Exam - Simple   No data filed      Lab Results  Component Value Date   WBC 5.4 06/23/2021   HGB 12.1 06/23/2021   HCT 35.8 06/23/2021   PLT 177 06/23/2021   GLUCOSE 103 (H) 07/22/2021   CHOL 165 06/23/2021   TRIG 90 06/23/2021   HDL 56 06/23/2021   LDLCALC 92 06/23/2021   ALT 12 07/22/2021   AST 17 07/22/2021   NA 140 07/22/2021   K 4.4 07/22/2021   CL 102 07/22/2021   CREATININE 1.00 07/22/2021   BUN 21 07/22/2021   CO2 23 07/22/2021   TSH 2.340 06/23/2021      Assessment &  Plan:   Problem List Items Addressed This Visit       Cardiovascular and Mediastinum   Essential hypertension, benign - Primary   Relevant Orders   Comprehensive metabolic panel  CBC with Differential/Platelet     Digestive   GERD without esophagitis     Nervous and Auditory   Bilateral impacted cerumen    Ear wax looped successfully.        Other   Osteoporosis screening   Relevant Orders   DG Bone Density   Preoperative clearance   Relevant Orders   DG Chest 2 View   Protime-INR   POCT URINALYSIS DIP (CLINITEK) (Completed)   Urine Culture   MRSA by NAA   Neck pain   Relevant Orders   DG Cervical Spine Complete   Mixed hyperlipidemia   Relevant Orders   Lipid panel  .  Meds ordered this encounter  Medications   tiZANidine (ZANAFLEX) 2 MG tablet    Sig: Take 1 tablet (2 mg total) by mouth every 8 (eight) hours as needed for muscle spasms.    Dispense:  40 tablet    Refill:  0    Orders Placed This Encounter  Procedures   Urine Culture   MRSA by NAA   DG Cervical Spine Complete   DG Bone Density   DG Chest 2 View   Comprehensive metabolic panel   Lipid panel   CBC with Differential/Platelet   Protime-INR   POCT URINALYSIS DIP (CLINITEK)   I,Marla I Leal-Borjas,acting as a scribe for Blane Ohara, MD.,have documented all relevant documentation on the behalf of Blane Ohara, MD,as directed by  Blane Ohara, MD while in the presence of Blane Ohara, MD.    Follow-up: No follow-ups on file.  An After Visit Summary was printed and given to the patient.  Blane Ohara, MD [facility] [phone]

## 2021-10-27 ENCOUNTER — Encounter: Payer: Self-pay | Admitting: Family Medicine

## 2021-10-27 ENCOUNTER — Ambulatory Visit: Payer: Medicare PPO | Admitting: Family Medicine

## 2021-10-27 VITALS — BP 120/60 | HR 78 | Temp 96.5°F | Resp 14 | Ht 64.0 in | Wt 143.0 lb

## 2021-10-27 DIAGNOSIS — I10 Essential (primary) hypertension: Secondary | ICD-10-CM | POA: Diagnosis not present

## 2021-10-27 DIAGNOSIS — K219 Gastro-esophageal reflux disease without esophagitis: Secondary | ICD-10-CM | POA: Diagnosis not present

## 2021-10-27 DIAGNOSIS — Z01818 Encounter for other preprocedural examination: Secondary | ICD-10-CM | POA: Diagnosis not present

## 2021-10-27 DIAGNOSIS — Z13820 Encounter for screening for osteoporosis: Secondary | ICD-10-CM | POA: Insufficient documentation

## 2021-10-27 DIAGNOSIS — E782 Mixed hyperlipidemia: Secondary | ICD-10-CM | POA: Diagnosis not present

## 2021-10-27 DIAGNOSIS — H6123 Impacted cerumen, bilateral: Secondary | ICD-10-CM | POA: Diagnosis not present

## 2021-10-27 DIAGNOSIS — M542 Cervicalgia: Secondary | ICD-10-CM

## 2021-10-27 LAB — POCT URINALYSIS DIP (CLINITEK)
Bilirubin, UA: NEGATIVE
Blood, UA: NEGATIVE
Glucose, UA: NEGATIVE mg/dL
Ketones, POC UA: NEGATIVE mg/dL
Nitrite, UA: NEGATIVE
POC PROTEIN,UA: NEGATIVE
Spec Grav, UA: 1.01 (ref 1.010–1.025)
Urobilinogen, UA: 0.2 E.U./dL
pH, UA: 6 (ref 5.0–8.0)

## 2021-10-27 MED ORDER — TIZANIDINE HCL 2 MG PO TABS: 2.0000 mg | ORAL_TABLET | Freq: Three times a day (TID) | ORAL | 0 refills | Status: DC | PRN

## 2021-10-27 NOTE — Assessment & Plan Note (Signed)
Ear wax looped successfully.

## 2021-10-28 ENCOUNTER — Ambulatory Visit (HOSPITAL_COMMUNITY)
Admission: RE | Admit: 2021-10-28 | Discharge: 2021-10-28 | Disposition: A | Discharge status: Home / Self Care | Payer: Medicare PPO | Source: Ambulatory Visit | Attending: Family Medicine | Admitting: Family Medicine

## 2021-10-28 DIAGNOSIS — M542 Cervicalgia: Secondary | ICD-10-CM | POA: Insufficient documentation

## 2021-10-28 DIAGNOSIS — Z01818 Encounter for other preprocedural examination: Secondary | ICD-10-CM | POA: Insufficient documentation

## 2021-10-28 LAB — LIPID PANEL
Chol/HDL Ratio: 3.5 ratio (ref 0.0–4.4)
Cholesterol, Total: 177 mg/dL (ref 100–199)
HDL: 51 mg/dL (ref >39)
LDL Chol Calc (NIH): 106 mg/dL — ABNORMAL HIGH (ref 0–99)
Triglycerides: 111 mg/dL (ref 0–149)
VLDL Cholesterol Cal: 20 mg/dL (ref 5–40)

## 2021-10-28 LAB — COMPREHENSIVE METABOLIC PANEL
ALT: 13 IU/L (ref 0–32)
AST: 17 IU/L (ref 0–40)
Albumin/Globulin Ratio: 1.8 (ref 1.2–2.2)
Albumin: 4.5 g/dL (ref 3.8–4.8)
Alkaline Phosphatase: 101 IU/L (ref 44–121)
BUN/Creatinine Ratio: 20 (ref 12–28)
BUN: 19 mg/dL (ref 8–27)
Bilirubin Total: 0.5 mg/dL (ref 0.0–1.2)
CO2: 23 mmol/L (ref 20–29)
Calcium: 9.5 mg/dL (ref 8.7–10.3)
Chloride: 104 mmol/L (ref 96–106)
Creatinine, Ser: 0.97 mg/dL (ref 0.57–1.00)
Globulin, Total: 2.5 g/dL (ref 1.5–4.5)
Glucose: 101 mg/dL — ABNORMAL HIGH (ref 70–99)
Potassium: 4.4 mmol/L (ref 3.5–5.2)
Sodium: 142 mmol/L (ref 134–144)
Total Protein: 7 g/dL (ref 6.0–8.5)
eGFR: 64 mL/min/{1.73_m2} (ref >59)

## 2021-10-28 LAB — CBC WITH DIFFERENTIAL/PLATELET
Basophils Absolute: 0.1 10*3/uL (ref 0.0–0.2)
Basos: 1 %
EOS (ABSOLUTE): 0.1 10*3/uL (ref 0.0–0.4)
Eos: 2 %
Hematocrit: 35.1 % (ref 34.0–46.6)
Hemoglobin: 11.9 g/dL (ref 11.1–15.9)
Immature Grans (Abs): 0 10*3/uL (ref 0.0–0.1)
Immature Granulocytes: 0 %
Lymphocytes Absolute: 2.4 10*3/uL (ref 0.7–3.1)
Lymphs: 43 %
MCH: 32.4 pg (ref 26.6–33.0)
MCHC: 33.9 g/dL (ref 31.5–35.7)
MCV: 96 fL (ref 79–97)
Monocytes Absolute: 0.8 10*3/uL (ref 0.1–0.9)
Monocytes: 14 %
Neutrophils Absolute: 2.3 10*3/uL (ref 1.4–7.0)
Neutrophils: 40 %
Platelets: 190 10*3/uL (ref 150–450)
RBC: 3.67 x10E6/uL — ABNORMAL LOW (ref 3.77–5.28)
RDW: 13.6 % (ref 11.7–15.4)
WBC: 5.7 10*3/uL (ref 3.4–10.8)

## 2021-10-28 LAB — URINE CULTURE

## 2021-10-28 LAB — PROTIME-INR
INR: 1 (ref 0.9–1.2)
Prothrombin Time: 10.3 s (ref 9.1–12.0)

## 2021-10-29 ENCOUNTER — Other Ambulatory Visit: Payer: Self-pay

## 2021-10-29 MED ORDER — MELOXICAM 7.5 MG PO TABS: 7.5000 mg | ORAL_TABLET | Freq: Every day | ORAL | 0 refills | Status: DC

## 2021-10-29 MED ORDER — ROSUVASTATIN CALCIUM 10 MG PO TABS: 10.0000 mg | ORAL_TABLET | Freq: Every day | ORAL | 1 refills | Status: DC

## 2021-10-30 NOTE — Assessment & Plan Note (Signed)
Ordered DEXA

## 2021-10-30 NOTE — Assessment & Plan Note (Signed)
Well controlled.  No changes to medicines, Rosuvastatin 5 mg daily. Continue to work on eating a healthy diet and exercise.  Labs drawn today.

## 2021-10-30 NOTE — Assessment & Plan Note (Signed)
The current medical regimen is effective;  continue present plan and medications Pantoprazole 40 mg daily, Famotidine 40 mg daily at bedtime.

## 2021-10-30 NOTE — Assessment & Plan Note (Signed)
Ordered chest xray, EKG. Check labs. Cleared for surgery.

## 2021-10-30 NOTE — Assessment & Plan Note (Signed)
Well controlled.  No changes to medicines.Telmisartan 40 mg daily.  Continue to work on eating a healthy diet and exercise.  Labs drawn today.

## 2021-10-30 NOTE — Assessment & Plan Note (Signed)
Ordered Cervical Xray.

## 2021-10-31 ENCOUNTER — Telehealth: Payer: Self-pay

## 2021-10-31 NOTE — Telephone Encounter (Signed)
Patient has an surgery on 12/15/2021. I called her to set up an appointment for surgery clearance. Call could not be completed.

## 2021-12-15 DIAGNOSIS — M1611 Unilateral primary osteoarthritis, right hip: Secondary | ICD-10-CM | POA: Diagnosis not present

## 2022-01-28 DIAGNOSIS — M542 Cervicalgia: Secondary | ICD-10-CM | POA: Diagnosis not present

## 2022-01-28 DIAGNOSIS — Z5189 Encounter for other specified aftercare: Secondary | ICD-10-CM | POA: Diagnosis not present

## 2022-01-29 DIAGNOSIS — M47812 Spondylosis without myelopathy or radiculopathy, cervical region: Secondary | ICD-10-CM | POA: Diagnosis not present

## 2022-01-29 DIAGNOSIS — M542 Cervicalgia: Secondary | ICD-10-CM | POA: Diagnosis not present

## 2022-02-10 DIAGNOSIS — M47812 Spondylosis without myelopathy or radiculopathy, cervical region: Secondary | ICD-10-CM | POA: Diagnosis not present

## 2022-02-10 DIAGNOSIS — M542 Cervicalgia: Secondary | ICD-10-CM | POA: Diagnosis not present

## 2022-02-12 ENCOUNTER — Other Ambulatory Visit: Payer: Self-pay | Admitting: Family Medicine

## 2022-02-12 DIAGNOSIS — I10 Essential (primary) hypertension: Secondary | ICD-10-CM

## 2022-02-12 DIAGNOSIS — K219 Gastro-esophageal reflux disease without esophagitis: Secondary | ICD-10-CM

## 2022-02-13 DIAGNOSIS — M542 Cervicalgia: Secondary | ICD-10-CM | POA: Diagnosis not present

## 2022-02-13 DIAGNOSIS — M47812 Spondylosis without myelopathy or radiculopathy, cervical region: Secondary | ICD-10-CM | POA: Diagnosis not present

## 2022-02-17 DIAGNOSIS — M542 Cervicalgia: Secondary | ICD-10-CM | POA: Diagnosis not present

## 2022-02-17 DIAGNOSIS — M47812 Spondylosis without myelopathy or radiculopathy, cervical region: Secondary | ICD-10-CM | POA: Diagnosis not present

## 2022-02-19 DIAGNOSIS — M542 Cervicalgia: Secondary | ICD-10-CM | POA: Diagnosis not present

## 2022-02-19 DIAGNOSIS — M47812 Spondylosis without myelopathy or radiculopathy, cervical region: Secondary | ICD-10-CM | POA: Diagnosis not present

## 2022-02-24 DIAGNOSIS — M542 Cervicalgia: Secondary | ICD-10-CM | POA: Diagnosis not present

## 2022-02-24 DIAGNOSIS — M47812 Spondylosis without myelopathy or radiculopathy, cervical region: Secondary | ICD-10-CM | POA: Diagnosis not present

## 2022-02-26 DIAGNOSIS — M47812 Spondylosis without myelopathy or radiculopathy, cervical region: Secondary | ICD-10-CM | POA: Diagnosis not present

## 2022-02-26 DIAGNOSIS — M542 Cervicalgia: Secondary | ICD-10-CM | POA: Diagnosis not present

## 2022-03-02 DIAGNOSIS — M542 Cervicalgia: Secondary | ICD-10-CM | POA: Diagnosis not present

## 2022-03-02 DIAGNOSIS — M47812 Spondylosis without myelopathy or radiculopathy, cervical region: Secondary | ICD-10-CM | POA: Diagnosis not present

## 2022-03-04 DIAGNOSIS — M47812 Spondylosis without myelopathy or radiculopathy, cervical region: Secondary | ICD-10-CM | POA: Diagnosis not present

## 2022-03-04 DIAGNOSIS — M542 Cervicalgia: Secondary | ICD-10-CM | POA: Diagnosis not present

## 2022-03-05 DIAGNOSIS — D485 Neoplasm of uncertain behavior of skin: Secondary | ICD-10-CM | POA: Diagnosis not present

## 2022-03-05 DIAGNOSIS — L814 Other melanin hyperpigmentation: Secondary | ICD-10-CM | POA: Diagnosis not present

## 2022-03-05 DIAGNOSIS — L578 Other skin changes due to chronic exposure to nonionizing radiation: Secondary | ICD-10-CM | POA: Diagnosis not present

## 2022-03-05 DIAGNOSIS — L821 Other seborrheic keratosis: Secondary | ICD-10-CM | POA: Diagnosis not present

## 2022-03-05 DIAGNOSIS — D225 Melanocytic nevi of trunk: Secondary | ICD-10-CM | POA: Diagnosis not present

## 2022-03-05 DIAGNOSIS — L570 Actinic keratosis: Secondary | ICD-10-CM | POA: Diagnosis not present

## 2022-03-05 DIAGNOSIS — L820 Inflamed seborrheic keratosis: Secondary | ICD-10-CM | POA: Diagnosis not present

## 2022-03-10 DIAGNOSIS — M47812 Spondylosis without myelopathy or radiculopathy, cervical region: Secondary | ICD-10-CM | POA: Diagnosis not present

## 2022-03-10 DIAGNOSIS — M542 Cervicalgia: Secondary | ICD-10-CM | POA: Diagnosis not present

## 2022-03-12 DIAGNOSIS — M542 Cervicalgia: Secondary | ICD-10-CM | POA: Diagnosis not present

## 2022-03-12 DIAGNOSIS — M47812 Spondylosis without myelopathy or radiculopathy, cervical region: Secondary | ICD-10-CM | POA: Diagnosis not present

## 2022-03-25 ENCOUNTER — Ambulatory Visit (INDEPENDENT_AMBULATORY_CARE_PROVIDER_SITE_OTHER): Payer: Medicare PPO

## 2022-03-25 DIAGNOSIS — Z23 Encounter for immunization: Secondary | ICD-10-CM

## 2022-05-04 ENCOUNTER — Other Ambulatory Visit: Payer: Self-pay

## 2022-05-04 ENCOUNTER — Other Ambulatory Visit: Payer: Self-pay | Admitting: Family Medicine

## 2022-05-04 DIAGNOSIS — I10 Essential (primary) hypertension: Secondary | ICD-10-CM

## 2022-05-04 MED ORDER — TELMISARTAN 40 MG PO TABS: 40.0000 mg | ORAL_TABLET | Freq: Every day | ORAL | 0 refills | Status: DC

## 2022-05-04 NOTE — Telephone Encounter (Signed)
Message sent.  Please call to set up your annual wellness visit.  Thank you, Dr. Sedalia Muta  Please call to set her up with her AWV. THANKS. DR. Sedalia Muta

## 2022-05-15 ENCOUNTER — Other Ambulatory Visit: Payer: Self-pay

## 2022-05-15 DIAGNOSIS — K219 Gastro-esophageal reflux disease without esophagitis: Secondary | ICD-10-CM

## 2022-05-15 MED ORDER — ROSUVASTATIN CALCIUM 10 MG PO TABS: 10.0000 mg | ORAL_TABLET | Freq: Every day | ORAL | 0 refills | Status: DC

## 2022-05-15 MED ORDER — FAMOTIDINE 40 MG PO TABS: 40.0000 mg | ORAL_TABLET | Freq: Every day | ORAL | 0 refills | Status: DC

## 2022-05-25 NOTE — Assessment & Plan Note (Signed)
Well controlled Patient is taking Telmisartan 40 mg  Nutrition: Stressed importance of moderation in sodium intake, saturated fat and cholesterol, caloric balance, sufficient intake of complex carbohydrates, fiber, calcium and iron.   Exercise: Stressed the importance of regular exercise.

## 2022-05-25 NOTE — Assessment & Plan Note (Signed)
Well controlled now Taking Pantoprazole 40 mg Taking Famotidine 40 mg OD Educated to taking only Protonix for now

## 2022-05-25 NOTE — Assessment & Plan Note (Signed)
Patient is taking Rosuvastatin 10 mg

## 2022-05-26 ENCOUNTER — Encounter: Payer: Self-pay | Admitting: Nurse Practitioner

## 2022-05-26 ENCOUNTER — Ambulatory Visit: Payer: Medicare PPO | Admitting: Nurse Practitioner

## 2022-05-26 VITALS — BP 130/70 | HR 75 | Temp 97.3°F | Resp 16 | Ht 64.0 in | Wt 144.0 lb

## 2022-05-26 DIAGNOSIS — E782 Mixed hyperlipidemia: Secondary | ICD-10-CM

## 2022-05-26 DIAGNOSIS — Z780 Asymptomatic menopausal state: Secondary | ICD-10-CM | POA: Insufficient documentation

## 2022-05-26 DIAGNOSIS — K219 Gastro-esophageal reflux disease without esophagitis: Secondary | ICD-10-CM

## 2022-05-26 DIAGNOSIS — Z13820 Encounter for screening for osteoporosis: Secondary | ICD-10-CM | POA: Insufficient documentation

## 2022-05-26 DIAGNOSIS — R21 Rash and other nonspecific skin eruption: Secondary | ICD-10-CM | POA: Diagnosis not present

## 2022-05-26 DIAGNOSIS — I10 Essential (primary) hypertension: Secondary | ICD-10-CM

## 2022-05-26 MED ORDER — FAMOTIDINE 40 MG PO TABS: 40.0000 mg | ORAL_TABLET | Freq: Every day | ORAL | 0 refills | Status: DC

## 2022-05-26 MED ORDER — TELMISARTAN 40 MG PO TABS: 40.0000 mg | ORAL_TABLET | Freq: Every day | ORAL | 0 refills | Status: DC

## 2022-05-26 MED ORDER — PANTOPRAZOLE SODIUM 40 MG PO TBEC: 40.0000 mg | DELAYED_RELEASE_TABLET | Freq: Every day | ORAL | 0 refills | Status: DC

## 2022-05-26 MED ORDER — BETAMETHASONE DIPROPIONATE AUG 0.05 % EX CREA: TOPICAL_CREAM | CUTANEOUS | 1 refills | Status: DC

## 2022-05-26 NOTE — Assessment & Plan Note (Signed)
Ordered Dexa scan Patient is taking over the counter calcium and vitamin D. She is doing regular excercises

## 2022-05-26 NOTE — Progress Notes (Signed)
Subjective:  Patient ID: Melinda Miller, female    DOB: 1950-07-16  Age: 72 y.o. MRN: [id number]  Chief Complaints:  Regular follow up  History of Present illness:  Melinda Miller is here for a regular follow up and she has hypertension, GERD, osteoarthritis of right hip and mixed hyperlipidemia.   For Hypertension:  She was last seen for hypertension 7 months ago.  BP at that visit was 120/60. Management includes Telmasartin 40 mg   She reports good compliance with treatment. She is not having side effects.  She is following a Regular diet. She is exercising. Swim two morning a week, and also walking 15-20 mins. She does not smoke.  For hyperlipidemia:      Duration of hyperlipidemia: chronic Cholesterol medication side effects: some muscle cramps so taking with COQ10 Cholesterol supplements: Past  cholesterol medications: Crestor 10 mg      Medication compliance: excellent compliance      Aspirin: no Recent stressors: no Recurrent headaches: no Visual changes: no Palpitations: no Dyspnea: no Chest pain: no Lower extremity edema: no Dizzy/lightheaded: no   Pertinent labs: Lab Results  Component Value Date   CHOL 177 10/27/2021   HDL 51 10/27/2021   LDLCALC 106 (H) 10/27/2021   TRIG 111 10/27/2021   CHOLHDL 3.5 10/27/2021   Lab Results  Component Value Date   NA 142 10/27/2021   K 4.4 10/27/2021   CREATININE 0.97 10/27/2021   EGFR 64 10/27/2021   GFRNONAA 68 10/19/2019   GLUCOSE 101 (H) 10/27/2021     The 10-year ASCVD risk score (Arnett DK, et al., 2019) is: 11.4%     Current Outpatient Medications on File Prior to Visit  Medication Sig Dispense Refill   augmented betamethasone dipropionate (DIPROLENE-AF) 0.05 % cream APPLY TO AFFECTED AREA TWICE A DAY 50 g 1   Calcium Carbonate-Vit D-Min (CALCIUM 1200 PO) Take 1,200 mg by mouth in the morning and at bedtime.     diclofenac Sodium (VOLTAREN) 1 % GEL Apply 4 g topically 4 (four) times daily. 350 g 2    famotidine (PEPCID) 40 MG tablet Take 1 tablet (40 mg total) by mouth at bedtime. 90 tablet 0   fluticasone (FLONASE) 50 MCG/ACT nasal spray Place 2 sprays into both nostrils daily. 16 g 6   pantoprazole (PROTONIX) 40 MG tablet TAKE 1 TABLET BY MOUTH ONCE DAILY 90 tablet 0   rosuvastatin (CRESTOR) 10 MG tablet Take 1 tablet (10 mg total) by mouth daily. 90 tablet 0   telmisartan (MICARDIS) 40 MG tablet Take 1 tablet (40 mg total) by mouth daily. 90 tablet 0   tiZANidine (ZANAFLEX) 2 MG tablet Take 1 tablet (2 mg total) by mouth every 8 (eight) hours as needed for muscle spasms. (Patient not taking: Reported on 05/26/2022) 40 tablet 0   No current facility-administered medications on file prior to visit.   Past Medical History:  Diagnosis Date   Essential hypertension    GERD (gastroesophageal reflux disease)    Mixed hyperlipidemia    Past Surgical History:  Procedure Laterality Date   ABDOMINAL HYSTERECTOMY      Family History  Problem Relation Age of Onset   Alzheimer's disease Mother    CAD Father    Heart attack Father    Cancer Sister        kidney   Alzheimer's disease Sister    Social History   Socioeconomic History   Marital status: Married    Spouse name: Melinda Miller  Number of children: 2   Years of education: Not on file   Highest education level: Not on file  Occupational History   Not on file  Tobacco Use   Smoking status: Never   Smokeless tobacco: Never  Substance and Sexual Activity   Alcohol use: Yes    Alcohol/week: 1.0 standard drink of alcohol    Types: 1 Cans of beer per week   Drug use: Never   Sexual activity: Not on file  Other Topics Concern   Not on file  Social History Narrative   Not on file   Social Determinants of Health   Financial Resource Strain: Not on file  Food Insecurity: No Food Insecurity (09/17/2020)   Hunger Vital Sign    Worried About Running Out of Food in the Last Year: Never true    [place] in the Last  Year: Never true  Transportation Needs: No Transportation Needs (09/17/2020)   PRAPARE - Hydrologist (Medical): No    Lack of Transportation (Non-Medical): No  Physical Activity: Insufficiently Active (09/17/2020)   Exercise Vital Sign    Days of Exercise per Week: 4 days    Minutes of Exercise per Session: 30 min  Stress: Not on file  Social Connections: Not on file    Review of Systems  Constitutional:  Negative for chills and fatigue.  HENT:  Negative for congestion, ear pain, sinus pain and sore throat.   Respiratory:  Negative for cough and shortness of breath.   Cardiovascular:  Negative for chest pain and leg swelling.  Gastrointestinal:  Negative for abdominal pain, constipation, diarrhea, nausea and vomiting.  Genitourinary:  Negative for dysuria and frequency.  Musculoskeletal:  Negative for arthralgias, back pain and myalgias.  Neurological:  Negative for dizziness and headaches.     Objective:  BP 130/70   Pulse 75   Temp (!) 97.3 F (36.3 C)   Resp 16   Ht 5\' 4"  (1.626 m)   Wt 144 lb (65.3 kg)   SpO2 98%   BMI 24.72 kg/m      05/26/2022    7:58 AM 10/27/2021    8:13 AM 08/12/2021    7:58 AM  BP/Weight  Systolic BP 650 354 656  Diastolic BP 70 60 80  Wt. (Lbs) 144 143 141  BMI 24.72 kg/m2 24.55 kg/m2 24.2 kg/m2    Physical Exam Vitals reviewed.  Constitutional:      Appearance: Normal appearance. She is normal weight.  Neck:     Vascular: No carotid bruit.  Cardiovascular:     Rate and Rhythm: Normal rate and regular rhythm.     Heart sounds: Normal heart sounds.  Pulmonary:     Effort: Pulmonary effort is normal.     Breath sounds: Normal breath sounds.  Abdominal:     General: Abdomen is flat. Bowel sounds are normal.     Palpations: Abdomen is soft.     Tenderness: There is no abdominal tenderness.  Musculoskeletal:        General: No swelling, tenderness, deformity or signs of injury.     Cervical back: Normal  range of motion.     Right lower leg: No edema.     Left lower leg: No edema.     Comments: Osteoarthritis changes on her hands  Neurological:     Mental Status: She is alert and oriented to person, place, and time.  Psychiatric:  Mood and Affect: Mood normal.        Behavior: Behavior normal.    Lab Results  Component Value Date   WBC 5.7 10/27/2021   HGB 11.9 10/27/2021   HCT 35.1 10/27/2021   PLT 190 10/27/2021   GLUCOSE 101 (H) 10/27/2021   CHOL 177 10/27/2021   TRIG 111 10/27/2021   HDL 51 10/27/2021   LDLCALC 106 (H) 10/27/2021   ALT 13 10/27/2021   AST 17 10/27/2021   NA 142 10/27/2021   K 4.4 10/27/2021   CL 104 10/27/2021   CREATININE 0.97 10/27/2021   BUN 19 10/27/2021   CO2 23 10/27/2021   TSH 2.340 06/23/2021   INR 1.0 10/27/2021    Assessment & Plan:   Problem List Items Addressed This Visit       Cardiovascular and Mediastinum   Essential hypertension, benign - Primary    Well controlled Patient is taking Telmisartan 40 mg  Nutrition: Stressed importance of moderation in sodium intake, saturated fat and cholesterol, caloric balance, sufficient intake of complex carbohydrates, fiber, calcium and iron.   Exercise: Stressed the importance of regular exercise.         Relevant Medications   telmisartan (MICARDIS) 40 MG tablet   Other Relevant Orders   CBC with Differential/Platelet   Comprehensive metabolic panel   TSH     Digestive   GERD without esophagitis    Well controlled now Taking Pantoprazole 40 mg Taking Famotidine 40 mg OD Educated to taking only Protonix for now      Relevant Medications   famotidine (PEPCID) 40 MG tablet   pantoprazole (PROTONIX) 40 MG tablet     Other   Mixed hyperlipidemia    Patient is taking Rosuvastatin 10 mg taking with COQ10 100mg  OD Still complaining muscle pain Will decrease crestor to 5 mg after the result of LDL today if it looks good       Relevant Medications   telmisartan (MICARDIS)  40 MG tablet   Other Relevant Orders   Hemoglobin A1c   Osteoporosis screening    Ordered Dexa scan Patient is taking over the counter calcium and vitamin D. She is doing regular excercises      Other Visit Diagnoses     Rash, skin       Relevant Medications   augmented betamethasone dipropionate (DIPROLENE-AF) 0.05 % cream   Encounter for osteoporosis screening in asymptomatic postmenopausal patient       Relevant Orders   DG Bone Density   DG Bone Density        Follow-up: Office will call with the lab results otherwise Follow up   in 6 months for chronic check up and fasting  I, Memorie Yokoyama have reviewed all documentation for this visit. The documentation on 05/26/22   for the exam, diagnosis, procedures, and orders are all accurate and complete.     An After Visit Summary was printed and given to the patient.  07/25/22, DNP, FNP [facility] [phone]

## 2022-05-26 NOTE — Patient Instructions (Signed)
Follow up in 6 months If lipid comes normal will change crestor from 10 to 5 mg , keep taking COQ10 100mg  once daily with Crestor now   DASH Eating Plan DASH stands for Dietary Approaches to Stop Hypertension. The DASH eating plan is a healthy eating plan that has been shown to: Reduce high blood pressure (hypertension). Reduce your risk for type 2 diabetes, heart disease, and stroke. Help with weight loss. What are tips for following this plan? Reading food labels Check food labels for the amount of salt (sodium) per serving. Choose foods with less than 5 percent of the Daily Value of sodium. Generally, foods with less than 300 milligrams (mg) of sodium per serving fit into this eating plan. To find whole grains, look for the word "whole" as the first word in the ingredient list. Shopping Buy products labeled as "low-sodium" or "no salt added." Buy fresh foods. Avoid canned foods and pre-made or frozen meals. Cooking Avoid adding salt when cooking. Use salt-free seasonings or herbs instead of table salt or sea salt. Check with your health care provider or pharmacist before using salt substitutes. Do not fry foods. Cook foods using healthy methods such as baking, boiling, grilling, roasting, and broiling instead. Cook with heart-healthy oils, such as olive, canola, avocado, soybean, or sunflower oil. Meal planning  Eat a balanced diet that includes: 4 or more servings of fruits and 4 or more servings of vegetables each day. Try to fill one-half of your plate with fruits and vegetables. 6-8 servings of whole grains each day. Less than 6 oz (170 g) of lean meat, poultry, or fish each day. A 3-oz (85-g) serving of meat is about the same size as a deck of cards. One egg equals 1 oz (28 g). 2-3 servings of low-fat dairy each day. One serving is 1 cup (237 mL). 1 serving of nuts, seeds, or beans 5 times each week. 2-3 servings of heart-healthy fats. Healthy fats called omega-3 fatty acids are  found in foods such as walnuts, flaxseeds, fortified milks, and eggs. These fats are also found in cold-water fish, such as sardines, salmon, and mackerel. Limit how much you eat of: Canned or prepackaged foods. Food that is high in trans fat, such as some fried foods. Food that is high in saturated fat, such as fatty meat. Desserts and other sweets, sugary drinks, and other foods with added sugar. Full-fat dairy products. Do not salt foods before eating. Do not eat more than 4 egg yolks a week. Try to eat at least 2 vegetarian meals a week. Eat more home-cooked food and less restaurant, buffet, and fast food. Lifestyle When eating at a restaurant, ask that your food be prepared with less salt or no salt, if possible. If you drink alcohol: Limit how much you use to: 0-1 drink a day for women who are not pregnant. 0-2 drinks a day for men. Be aware of how much alcohol is in your drink. In the [place] one drink equals one 12 oz bottle of beer (355 mL), one 5 oz glass of wine (148 mL), or one 1 oz glass of hard liquor (44 mL). General information Avoid eating more than 2,300 mg of salt a day. If you have hypertension, you may need to reduce your sodium intake to 1,500 mg a day. Work with your health care provider to maintain a healthy body weight or to lose weight. Ask what an ideal weight is for you. Get at least 30 minutes of exercise that  causes your heart to beat faster (aerobic exercise) most days of the week. Activities may include walking, swimming, or biking. Work with your health care provider or dietitian to adjust your eating plan to your individual calorie needs. What foods should I eat? Fruits All fresh, dried, or frozen fruit. Canned fruit in natural juice (without added sugar). Vegetables Fresh or frozen vegetables (raw, steamed, roasted, or grilled). Low-sodium or reduced-sodium tomato and vegetable juice. Low-sodium or reduced-sodium tomato sauce and tomato paste. Low-sodium  or reduced-sodium canned vegetables. Grains Whole-grain or whole-wheat bread. Whole-grain or whole-wheat pasta. Brown rice. Orpah Cobb. Bulgur. Whole-grain and low-sodium cereals. Pita bread. Low-fat, low-sodium crackers. Whole-wheat flour tortillas. Meats and other proteins Skinless chicken or [place]. Ground chicken or [place]. Pork with fat trimmed off. Fish and seafood. Egg whites. Dried beans, peas, or lentils. Unsalted nuts, nut butters, and seeds. Unsalted canned beans. Lean cuts of beef with fat trimmed off. Low-sodium, lean precooked or cured meat, such as sausages or meat loaves. Dairy Low-fat (1%) or fat-free (skim) milk. Reduced-fat, low-fat, or fat-free cheeses. Nonfat, low-sodium ricotta or cottage cheese. Low-fat or nonfat yogurt. Low-fat, low-sodium cheese. Fats and oils Soft margarine without trans fats. Vegetable oil. Reduced-fat, low-fat, or light mayonnaise and salad dressings (reduced-sodium). Canola, safflower, olive, avocado, soybean, and sunflower oils. Avocado. Seasonings and condiments Herbs. Spices. Seasoning mixes without salt. Other foods Unsalted popcorn and pretzels. Fat-free sweets. The items listed above may not be a complete list of foods and beverages you can eat. Contact a dietitian for more information. What foods should I avoid? Fruits Canned fruit in a light or heavy syrup. Fried fruit. Fruit in cream or butter sauce. Vegetables Creamed or fried vegetables. Vegetables in a cheese sauce. Regular canned vegetables (not low-sodium or reduced-sodium). Regular canned tomato sauce and paste (not low-sodium or reduced-sodium). Regular tomato and vegetable juice (not low-sodium or reduced-sodium). Rosita Fire. Olives. Grains Baked goods made with fat, such as croissants, muffins, or some breads. Dry pasta or rice meal packs. Meats and other proteins Fatty cuts of meat. Ribs. Fried meat. Tomasa Blase. Bologna, salami, and other precooked or cured meats, such as sausages or  meat loaves. Fat from the back of a pig (fatback). Bratwurst. Salted nuts and seeds. Canned beans with added salt. Canned or smoked fish. Whole eggs or egg yolks. Chicken or [place] with skin. Dairy Whole or 2% milk, cream, and half-and-half. Whole or full-fat cream cheese. Whole-fat or sweetened yogurt. Full-fat cheese. Nondairy creamers. Whipped toppings. Processed cheese and cheese spreads. Fats and oils Butter. Stick margarine. Lard. Shortening. Ghee. Bacon fat. Tropical oils, such as coconut, palm kernel, or palm oil. Seasonings and condiments Onion salt, garlic salt, seasoned salt, table salt, and sea salt. Worcestershire sauce. Tartar sauce. Barbecue sauce. Teriyaki sauce. Soy sauce, including reduced-sodium. Steak sauce. Canned and packaged gravies. Fish sauce. Oyster sauce. Cocktail sauce. Store-bought horseradish. Ketchup. Mustard. Meat flavorings and tenderizers. Bouillon cubes. Hot sauces. Pre-made or packaged marinades. Pre-made or packaged taco seasonings. Relishes. Regular salad dressings. Other foods Salted popcorn and pretzels. The items listed above may not be a complete list of foods and beverages you should avoid. Contact a dietitian for more information. Where to find more information National Heart, Lung, and Blood Institute: PopSteam.is American Heart Association: www.heart.org Academy of Nutrition and Dietetics: www.eatright.org National Kidney Foundation: www.kidney.org Summary The DASH eating plan is a healthy eating plan that has been shown to reduce high blood pressure (hypertension). It may also reduce your risk for type 2 diabetes, heart  disease, and stroke. When on the DASH eating plan, aim to eat more fresh fruits and vegetables, whole grains, lean proteins, low-fat dairy, and heart-healthy fats. With the DASH eating plan, you should limit salt (sodium) intake to 2,300 mg a day. If you have hypertension, you may need to reduce your sodium intake to 1,500 mg a  day. Work with your health care provider or dietitian to adjust your eating plan to your individual calorie needs. This information is not intended to replace advice given to you by your health care provider. Make sure you discuss any questions you have with your health care provider. Document Revised: 04/07/2019 Document Reviewed: 04/07/2019 Elsevier Patient Education  [place].

## 2022-05-27 LAB — CBC WITH DIFFERENTIAL/PLATELET
Basophils Absolute: 0.1 10*3/uL (ref 0.0–0.2)
Basos: 1 %
EOS (ABSOLUTE): 0.1 10*3/uL (ref 0.0–0.4)
Eos: 2 %
Hematocrit: 34.7 % (ref 34.0–46.6)
Hemoglobin: 11.9 g/dL (ref 11.1–15.9)
Immature Grans (Abs): 0 10*3/uL (ref 0.0–0.1)
Immature Granulocytes: 0 %
Lymphocytes Absolute: 2.4 10*3/uL (ref 0.7–3.1)
Lymphs: 43 %
MCH: 32.6 pg (ref 26.6–33.0)
MCHC: 34.3 g/dL (ref 31.5–35.7)
MCV: 95 fL (ref 79–97)
Monocytes Absolute: 0.8 10*3/uL (ref 0.1–0.9)
Monocytes: 14 %
Neutrophils Absolute: 2.2 10*3/uL (ref 1.4–7.0)
Neutrophils: 40 %
Platelets: 203 10*3/uL (ref 150–450)
RBC: 3.65 x10E6/uL — ABNORMAL LOW (ref 3.77–5.28)
RDW: 13.6 % (ref 11.7–15.4)
WBC: 5.6 10*3/uL (ref 3.4–10.8)

## 2022-05-27 LAB — COMPREHENSIVE METABOLIC PANEL
ALT: 12 IU/L (ref 0–32)
AST: 17 IU/L (ref 0–40)
Albumin/Globulin Ratio: 1.7 (ref 1.2–2.2)
Albumin: 4.3 g/dL (ref 3.9–4.9)
Alkaline Phosphatase: 99 IU/L (ref 44–121)
BUN/Creatinine Ratio: 21 (ref 12–28)
BUN: 19 mg/dL (ref 8–27)
Bilirubin Total: 0.5 mg/dL (ref 0.0–1.2)
CO2: 21 mmol/L (ref 20–29)
Calcium: 9.6 mg/dL (ref 8.7–10.3)
Chloride: 103 mmol/L (ref 96–106)
Creatinine, Ser: 0.89 mg/dL (ref 0.57–1.00)
Globulin, Total: 2.5 g/dL (ref 1.5–4.5)
Glucose: 105 mg/dL — ABNORMAL HIGH (ref 70–99)
Potassium: 4.6 mmol/L (ref 3.5–5.2)
Sodium: 139 mmol/L (ref 134–144)
Total Protein: 6.8 g/dL (ref 6.0–8.5)
eGFR: 70 mL/min/{1.73_m2} (ref >59)

## 2022-05-27 LAB — HEMOGLOBIN A1C
Est. average glucose Bld gHb Est-mCnc: 131 mg/dL
Hgb A1c MFr Bld: 6.2 % — ABNORMAL HIGH (ref 4.8–5.6)

## 2022-05-27 LAB — TSH: TSH: 2.39 u[IU]/mL (ref 0.450–4.500)

## 2022-06-30 DIAGNOSIS — L910 Hypertrophic scar: Secondary | ICD-10-CM | POA: Diagnosis not present

## 2022-07-22 ENCOUNTER — Other Ambulatory Visit: Payer: Medicare PPO

## 2022-08-25 ENCOUNTER — Other Ambulatory Visit: Payer: Self-pay

## 2022-08-25 MED ORDER — ROSUVASTATIN CALCIUM 10 MG PO TABS: 10.0000 mg | ORAL_TABLET | Freq: Every day | ORAL | 0 refills | Status: DC

## 2022-08-28 DIAGNOSIS — Z711 Person with feared health complaint in whom no diagnosis is made: Secondary | ICD-10-CM | POA: Diagnosis not present

## 2022-08-30 DIAGNOSIS — T161XXA Foreign body in right ear, initial encounter: Secondary | ICD-10-CM | POA: Diagnosis not present

## 2022-09-08 DIAGNOSIS — Z1231 Encounter for screening mammogram for malignant neoplasm of breast: Secondary | ICD-10-CM | POA: Diagnosis not present

## 2022-09-08 LAB — HM MAMMOGRAPHY

## 2022-10-29 ENCOUNTER — Other Ambulatory Visit: Payer: Self-pay

## 2022-10-29 DIAGNOSIS — K219 Gastro-esophageal reflux disease without esophagitis: Secondary | ICD-10-CM

## 2022-10-29 MED ORDER — PANTOPRAZOLE SODIUM 40 MG PO TBEC: 40.0000 mg | DELAYED_RELEASE_TABLET | Freq: Every day | ORAL | 0 refills | Status: DC

## 2022-11-16 ENCOUNTER — Telehealth: Payer: Self-pay

## 2022-11-16 ENCOUNTER — Other Ambulatory Visit: Payer: Self-pay

## 2022-11-16 DIAGNOSIS — K219 Gastro-esophageal reflux disease without esophagitis: Secondary | ICD-10-CM

## 2022-11-16 DIAGNOSIS — I10 Essential (primary) hypertension: Secondary | ICD-10-CM

## 2022-11-16 MED ORDER — FAMOTIDINE 40 MG PO TABS: 40.0000 mg | ORAL_TABLET | Freq: Every day | ORAL | 0 refills | Status: DC

## 2022-11-16 MED ORDER — TELMISARTAN 40 MG PO TABS: 40.0000 mg | ORAL_TABLET | Freq: Every day | ORAL | 0 refills | Status: DC

## 2022-11-16 MED ORDER — ROSUVASTATIN CALCIUM 10 MG PO TABS: 10.0000 mg | ORAL_TABLET | Freq: Every day | ORAL | 0 refills | Status: DC

## 2022-11-16 NOTE — Telephone Encounter (Signed)
Called patient to schedule Medicare Annual Wellness Visit (AWV). Left message for patient to call back and schedule Medicare Annual Wellness Visit (AWV).

## 2022-11-18 ENCOUNTER — Other Ambulatory Visit: Payer: Self-pay | Admitting: Family Medicine

## 2022-11-18 DIAGNOSIS — I10 Essential (primary) hypertension: Secondary | ICD-10-CM

## 2022-11-18 DIAGNOSIS — K219 Gastro-esophageal reflux disease without esophagitis: Secondary | ICD-10-CM

## 2022-12-13 NOTE — Progress Notes (Unsigned)
Subjective:  Patient ID: Melinda Miller, female    DOB: 1950-07-01  Age: 72 y.o. MRN: [id number]  Chief Complaint  Patient presents with   Medical Management of Chronic Issues    HPI   s Sarai is here for a regular follow up and she has hypertension, GERD, and mixed hyperlipidemia.   For Hypertension:  She was last seen for hypertension 7 months ago.  BP at that visit was  Management includes Telmasartin 40 mg   She reports good compliance with treatment. She is not having side effects.  She is following a Regular diet. She is exercising. Swim two morning a week, and also walking 15-20 mins. She does not smoke.  For hyperlipidemia:      Duration of hyperlipidemia: chronic Cholesterol medication side effects: some muscle cramps so taking with COQ10 Cholesterol supplements: Past  cholesterol medications: Crestor 10 mg      Medication compliance: excellent compliance      Aspirin: no Recent stressors: no Recurrent headaches: no Visual changes: no Palpitations: no Dyspnea: no Chest pain: no Lower extremity edema: no Dizzy/lightheaded: no      07/28/2021    3:46 PM 11/13/2020    9:42 AM 09/17/2020    9:28 AM 10/19/2019    1:58 PM  Depression screen PHQ 2/9  Decreased Interest 0 0 0 0  Down, Depressed, Hopeless 0 0 0 0  PHQ - 2 Score 0 0 0 0        05/26/2022    8:06 AM  Fall Risk   Falls in the past year? 0  Number falls in past yr: 0  Injury with Fall? 0  Risk for fall due to : No Fall Risks    Patient Care Team: CoxFritzi Mandes, MD as PCP - General (Family Medicine)   Review of Systems  Current Outpatient Medications on File Prior to Visit  Medication Sig Dispense Refill   augmented betamethasone dipropionate (DIPROLENE-AF) 0.05 % cream APPLY TO AFFECTED AREA TWICE A DAY 50 g 1   Calcium Carbonate-Vit D-Min (CALCIUM 1200 PO) Take 1,200 mg by mouth in the morning and at bedtime.     diclofenac Sodium (VOLTAREN) 1 % GEL Apply 4 g topically 4 (four) times  daily. 350 g 2   famotidine (PEPCID) 40 MG tablet Take 1 tablet (40 mg total) by mouth at bedtime. 90 tablet 0   fluticasone (FLONASE) 50 MCG/ACT nasal spray Place 2 sprays into both nostrils daily. 16 g 6   pantoprazole (PROTONIX) 40 MG tablet Take 1 tablet (40 mg total) by mouth daily. 90 tablet 0   rosuvastatin (CRESTOR) 10 MG tablet Take 1 tablet (10 mg total) by mouth daily. 90 tablet 0   telmisartan (MICARDIS) 40 MG tablet Take 1 tablet (40 mg total) by mouth daily. 90 tablet 0   No current facility-administered medications on file prior to visit.   Past Medical History:  Diagnosis Date   Essential hypertension    GERD (gastroesophageal reflux disease)    Mixed hyperlipidemia    Past Surgical History:  Procedure Laterality Date   ABDOMINAL HYSTERECTOMY      Family History  Problem Relation Age of Onset   Alzheimer's disease Mother    CAD Father    Heart attack Father    Cancer Sister        kidney   Alzheimer's disease Sister    Social History   Socioeconomic History   Marital status: Married    Spouse name: Viviann Spare  Leitch   Number of children: 2   Years of education: Not on file   Highest education level: Not on file  Occupational History   Not on file  Tobacco Use   Smoking status: Never   Smokeless tobacco: Never  Substance and Sexual Activity   Alcohol use: Yes    Alcohol/week: 1.0 standard drink of alcohol    Types: 1 Cans of beer per week   Drug use: Never   Sexual activity: Not on file  Other Topics Concern   Not on file  Social History Narrative   Not on file   Social Determinants of Health   Financial Resource Strain: Not on file  Food Insecurity: No Food Insecurity (09/17/2020)   Hunger Vital Sign    Worried About Running Out of Food in the Last Year: Never true    Ran Out of Food in the Last Year: Never true  Transportation Needs: No Transportation Needs (09/17/2020)   PRAPARE - Administrator, Civil Service (Medical): No    Lack  of Transportation (Non-Medical): No  Physical Activity: Insufficiently Active (09/17/2020)   Exercise Vital Sign    Days of Exercise per Week: 4 days    Minutes of Exercise per Session: 30 min  Stress: Not on file  Social Connections: Not on file    Objective:  There were no vitals taken for this visit.     05/26/2022    7:58 AM 10/27/2021    8:13 AM 08/12/2021    7:58 AM  BP/Weight  Systolic BP 130 120 122  Diastolic BP 70 60 80  Wt. (Lbs) 144 143 141  BMI 24.72 kg/m2 24.55 kg/m2 24.2 kg/m2    Physical Exam  Diabetic Foot Exam - Simple   No data filed      Lab Results  Component Value Date   WBC 5.6 05/26/2022   HGB 11.9 05/26/2022   HCT 34.7 05/26/2022   PLT 203 05/26/2022   GLUCOSE 105 (H) 05/26/2022   CHOL 177 10/27/2021   TRIG 111 10/27/2021   HDL 51 10/27/2021   LDLCALC 106 (H) 10/27/2021   ALT 12 05/26/2022   AST 17 05/26/2022   NA 139 05/26/2022   K 4.6 05/26/2022   CL 103 05/26/2022   CREATININE 0.89 05/26/2022   BUN 19 05/26/2022   CO2 21 05/26/2022   TSH 2.390 05/26/2022   INR 1.0 10/27/2021   HGBA1C 6.2 (H) 05/26/2022      Assessment & Plan:    Essential hypertension, benign Assessment & Plan: Well controlled Patient is taking Telmisartan 40 mg     GERD without esophagitis Assessment & Plan: Well controlled now Taking Pantoprazole 40 mg Taking Famotidine 40 mg OD Educated to taking only Protonix for now   Mixed hyperlipidemia Assessment & Plan: Well controlled.  No changes to medicines. Rosuvastatin 10 mg daily. Continue to work on eating a healthy diet and exercise.  Labs drawn today.        No orders of the defined types were placed in this encounter.   No orders of the defined types were placed in this encounter.    Follow-up: No follow-ups on file.   I,Marla I Leal-Borjas,acting as a scribe for Blane Ohara, MD.,have documented all relevant documentation on the behalf of Blane Ohara, MD,as directed by  Blane Ohara,  MD while in the presence of Blane Ohara, MD.   An After Visit Summary was printed and given to the patient.  Blane Ohara,  MD [facility] [phone]

## 2022-12-13 NOTE — Assessment & Plan Note (Signed)
Well controlled Patient is taking Telmisartan 40 mg

## 2022-12-13 NOTE — Assessment & Plan Note (Signed)
Well controlled now Taking Pantoprazole 40 mg Taking Famotidine 40 mg OD Educated to taking only Protonix for now

## 2022-12-13 NOTE — Assessment & Plan Note (Signed)
Well controlled.  No changes to medicines. Rosuvastatin 10 mg daily. Continue to work on eating a healthy diet and exercise.  Labs drawn today.   

## 2022-12-14 ENCOUNTER — Encounter: Payer: Self-pay | Admitting: Family Medicine

## 2022-12-14 ENCOUNTER — Ambulatory Visit: Payer: Medicare PPO | Admitting: Family Medicine

## 2022-12-14 VITALS — BP 110/60 | HR 70 | Temp 97.2°F | Ht 64.0 in | Wt 141.0 lb

## 2022-12-14 DIAGNOSIS — I10 Essential (primary) hypertension: Secondary | ICD-10-CM | POA: Diagnosis not present

## 2022-12-14 DIAGNOSIS — E782 Mixed hyperlipidemia: Secondary | ICD-10-CM

## 2022-12-14 DIAGNOSIS — K219 Gastro-esophageal reflux disease without esophagitis: Secondary | ICD-10-CM

## 2022-12-14 DIAGNOSIS — Z1211 Encounter for screening for malignant neoplasm of colon: Secondary | ICD-10-CM

## 2022-12-14 DIAGNOSIS — R21 Rash and other nonspecific skin eruption: Secondary | ICD-10-CM | POA: Diagnosis not present

## 2022-12-14 MED ORDER — BETAMETHASONE DIPROPIONATE AUG 0.05 % EX CREA
TOPICAL_CREAM | CUTANEOUS | 1 refills | Status: DC
Start: 2022-12-14 — End: 2023-03-25

## 2022-12-14 MED ORDER — ROSUVASTATIN CALCIUM 10 MG PO TABS: 10.0000 mg | ORAL_TABLET | Freq: Every day | ORAL | 0 refills | Status: DC

## 2022-12-14 MED ORDER — TELMISARTAN 40 MG PO TABS
40.0000 mg | ORAL_TABLET | Freq: Every day | ORAL | 0 refills | Status: DC
Start: 2022-12-14 — End: 2023-03-25

## 2022-12-14 MED ORDER — FAMOTIDINE 40 MG PO TABS
40.0000 mg | ORAL_TABLET | Freq: Every day | ORAL | 0 refills | Status: DC
Start: 2022-12-14 — End: 2023-03-25

## 2022-12-14 MED ORDER — PANTOPRAZOLE SODIUM 40 MG PO TBEC
40.0000 mg | DELAYED_RELEASE_TABLET | Freq: Every day | ORAL | 0 refills | Status: DC
Start: 2022-12-14 — End: 2023-03-25

## 2022-12-14 NOTE — Patient Instructions (Signed)
Plan to have shingrix and Tdap vaccines at pharmacy

## 2022-12-14 NOTE — Assessment & Plan Note (Signed)
------------------------------------------------------      ------------------------------------------------------  diclofenac gel

## 2023-01-20 ENCOUNTER — Ambulatory Visit (INDEPENDENT_AMBULATORY_CARE_PROVIDER_SITE_OTHER): Payer: Medicare PPO

## 2023-01-20 VITALS — BP 118/62 | HR 73 | Resp 16 | Ht 64.0 in | Wt 141.2 lb

## 2023-01-20 DIAGNOSIS — Z9189 Other specified personal risk factors, not elsewhere classified: Secondary | ICD-10-CM

## 2023-01-20 DIAGNOSIS — Z1211 Encounter for screening for malignant neoplasm of colon: Secondary | ICD-10-CM

## 2023-01-20 DIAGNOSIS — Z0000 Encounter for general adult medical examination without abnormal findings: Secondary | ICD-10-CM

## 2023-01-20 DIAGNOSIS — Z1212 Encounter for screening for malignant neoplasm of rectum: Secondary | ICD-10-CM | POA: Diagnosis not present

## 2023-01-20 DIAGNOSIS — N959 Unspecified menopausal and perimenopausal disorder: Secondary | ICD-10-CM | POA: Diagnosis not present

## 2023-01-20 NOTE — Progress Notes (Signed)
Subjective:   Melinda Miller is a 72 y.o. female who presents for Medicare Annual (Subsequent) preventive examination.  This wellness visit is conducted by a nurse.  The patient's medications were reviewed and reconciled since the patient's last visit.  History details were provided by the patient.  The history appears to be reliable.    Medical History: Patient history and Family history was reviewed  Medications, Allergies, and preventative health maintenance was reviewed and updated.   Visit Complete: In person  Cardiac Risk Factors include: advanced age (>63men, >85 women);dyslipidemia;hypertension     Objective:    Today's Vitals   01/20/23 0901  BP: 118/62  Pulse: 73  Resp: 16  SpO2: 95%  Weight: 141 lb 3.2 oz (64 kg)  Height: 5\' 4"  (1.626 m)  PainSc: 0-No pain   Body mass index is 24.24 kg/m.     09/17/2020    9:30 AM  Advanced Directives  Does Patient Have a Medical Advance Directive? Yes  Type of Advance Directive Living will  Does patient want to make changes to medical advance directive? No - Patient declined    Current Medications (verified) Outpatient Encounter Medications as of 01/20/2023  Medication Sig   augmented betamethasone dipropionate (DIPROLENE-AF) 0.05 % cream APPLY TO AFFECTED AREA TWICE A DAY   Calcium Carbonate-Vit D-Min (CALCIUM 1200 PO) Take 1,200 mg by mouth in the morning and at bedtime.   diclofenac Sodium (VOLTAREN) 1 % GEL Apply 4 g topically 4 (four) times daily.   famotidine (PEPCID) 40 MG tablet Take 1 tablet (40 mg total) by mouth at bedtime.   pantoprazole (PROTONIX) 40 MG tablet Take 1 tablet (40 mg total) by mouth daily.   rosuvastatin (CRESTOR) 10 MG tablet Take 1 tablet (10 mg total) by mouth daily.   telmisartan (MICARDIS) 40 MG tablet Take 1 tablet (40 mg total) by mouth daily.   No facility-administered encounter medications on file as of 01/20/2023.    Allergies (verified) Sulfa antibiotics and Shellfish allergy    History: Past Medical History:  Diagnosis Date   Essential hypertension    GERD (gastroesophageal reflux disease)    Mixed hyperlipidemia    Past Surgical History:  Procedure Laterality Date   ABDOMINAL HYSTERECTOMY     JOINT REPLACEMENT Right 2023   Hip   Family History  Problem Relation Age of Onset   Alzheimer's disease Mother    CAD Father    Heart attack Father    Cancer Sister        kidney   Alzheimer's disease Sister    Social History   Socioeconomic History   Marital status: Married    Spouse name: Elinora Veerkamp   Number of children: 2   Years of education: Not on file   Highest education level: Not on file  Occupational History   Occupation: Runner, broadcasting/film/video (Retired)  Tobacco Use   Smoking status: Never   Smokeless tobacco: Never  Vaping Use   Vaping status: Never Used  Substance and Sexual Activity   Alcohol use: Yes    Alcohol/week: 1.0 standard drink of alcohol    Types: 1 Cans of beer per week   Drug use: Never   Sexual activity: Not on file  Other Topics Concern   Not on file  Social History Narrative   Not on file   Social Determinants of Health   Financial Resource Strain: Low Risk  (12/14/2022)   Overall Financial Resource Strain (CARDIA)    Difficulty of Paying Living  Expenses: Not hard at all  Food Insecurity: No Food Insecurity (12/14/2022)   Hunger Vital Sign    Worried About Running Out of Food in the Last Year: Never true    Ran Out of Food in the Last Year: Never true  Transportation Needs: No Transportation Needs (12/14/2022)   PRAPARE - Administrator, Civil Service (Medical): No    Lack of Transportation (Non-Medical): No  Physical Activity: Sufficiently Active (12/14/2022)   Exercise Vital Sign    Days of Exercise per Week: 4 days    Minutes of Exercise per Session: 60 min  Stress: No Stress Concern Present (12/14/2022)   [employer] of Occupational Health - Occupational Stress Questionnaire    Feeling of Stress  : Not at all  Social Connections: Moderately Integrated (12/14/2022)   Social Connection and Isolation Panel [NHANES]    Frequency of Communication with Friends and Family: More than three times a week    Frequency of Social Gatherings with Friends and Family: More than three times a week    Attends Religious Services: More than 4 times per year    Active Member of [employer] or Organizations: No    Attends Engineer, structural: Never    Marital Status: Married    Tobacco Counseling Counseling given: Not Answered   Clinical Intake:  Pre-visit preparation completed: Yes  Pain : No/denies pain Pain Score: 0-No pain   BMI - recorded: 24.24 Nutritional Status: BMI of 19-24  Normal Nutritional Risks: None Diabetes: No How often do you need to have someone help you when you read instructions, pamphlets, or other written materials from your doctor or pharmacy?: 1 - Never Interpreter Needed?: No    Activities of Daily Living    01/20/2023    9:05 AM  In your present state of health, do you have any difficulty performing the following activities:  Hearing? 0  Vision? 0  Difficulty concentrating or making decisions? 0  Walking or climbing stairs? 0  Dressing or bathing? 0  Doing errands, shopping? 0  Preparing Food and eating ? N  Using the Toilet? N  In the past six months, have you accidently leaked urine? N  Do you have problems with loss of bowel control? N  Managing your Medications? N  Managing your Finances? N  Housekeeping or managing your Housekeeping? N    Patient Care Team: Blane Ohara, MD as PCP - General (Family Medicine)     Assessment:   This is a routine wellness examination for [name].  Hearing/Vision screen No results found.  Dietary issues and exercise activities discussed: Aim for 30 minutes of exercise or brisk walking, 6-8 glasses of water, and 5 servings of fruits and vegetables each day.     Depression Screen    01/20/2023    9:26 AM  12/14/2022    7:59 AM 07/28/2021    3:46 PM 11/13/2020    9:42 AM 09/17/2020    9:28 AM 10/19/2019    1:58 PM  PHQ 2/9 Scores  PHQ - 2 Score 0 0 0 0 0 0    Fall Risk    01/20/2023    9:03 AM 12/14/2022    7:59 AM 05/26/2022    8:06 AM 10/27/2021    8:17 AM 07/28/2021    3:46 PM  Fall Risk   Falls in the past year? 0 0 0 0 0  Number falls in past yr: 0 0 0 0 0  Injury with Fall? 0  0 0 0 0  Risk for fall due to : No Fall Risks No Fall Risks No Fall Risks Impaired balance/gait   Follow up Falls evaluation completed;Education provided Falls evaluation completed  Falls evaluation completed Falls evaluation completed    MEDICARE RISK AT HOME: Medicare Risk at Home Any stairs in or around the home?: No If so, are there any without handrails?: No Home free of loose throw rugs in walkways, pet beds, electrical cords, etc?: Yes Adequate lighting in your home to reduce risk of falls?: Yes Life alert?: No Use of a cane, walker or w/c?: No Grab bars in the bathroom?: No Shower chair or bench in shower?: No Elevated toilet seat or a handicapped toilet?: No  TIMED UP AND GO:  Was the test performed?  Yes  Length of time to ambulate 10 feet: 5 sec Gait steady and fast without use of assistive device    Cognitive Function:        01/20/2023    9:27 AM 09/17/2020    9:32 AM  6CIT Screen  What Year? 0 points 0 points  What month? 0 points 0 points  What time? 0 points 0 points  Count back from 20 0 points 0 points  Months in reverse 0 points 0 points  Repeat phrase 0 points 0 points  Total Score 0 points 0 points    Immunizations Immunization History  Administered Date(s) Administered   Fluad Quad(high Dose 65+) 02/17/2021, 03/25/2022   PFIZER(Purple Top)SARS-COV-2 Vaccination 06/10/2019, 07/01/2019, 02/12/2020   Pfizer Covid-19 Vaccine Bivalent Booster 42yrs & up 02/26/2021   Pneumococcal Conjugate-13 11/13/2020   Pneumococcal Polysaccharide-23 10/19/2019   Tdap 02/17/2011    TDAP  status: Due, Education has been provided regarding the importance of this vaccine. Advised may receive this vaccine at local pharmacy or Health Dept. Aware to provide a copy of the vaccination record if obtained from local pharmacy or Health Dept. Verbalized acceptance and understanding.  Flu Vaccine status: Due, Education has been provided regarding the importance of this vaccine. Advised may receive this vaccine at local pharmacy or Health Dept. Aware to provide a copy of the vaccination record if obtained from local pharmacy or Health Dept. Verbalized acceptance and understanding.  Pneumococcal vaccine status: Up to date  Covid-19 vaccine status: Information provided on how to obtain vaccines.   Qualifies for Shingles Vaccine? Yes   Zostavax completed No   Shingrix Completed?: No.    Education has been provided regarding the importance of this vaccine. Patient has been advised to call insurance company to determine out of pocket expense if they have not yet received this vaccine. Advised may also receive vaccine at local pharmacy or Health Dept. Verbalized acceptance and understanding.  Screening Tests Health Maintenance  Topic Date Due   Colonoscopy  Never done   Zoster Vaccines- Shingrix (1 of 2) Never done   DEXA SCAN  Never done   DTaP/Tdap/Td (2 - Td or Tdap) 02/16/2021   Medicare Annual Wellness (AWV)  09/17/2021   INFLUENZA VACCINE  12/17/2022   COVID-19 Vaccine (5 - 2023-24 season) 01/17/2023   MAMMOGRAM  09/08/2023   Pneumonia Vaccine 9+ Years old  Completed   HPV VACCINES  Aged Out   Hepatitis C Screening  Discontinued    Health Maintenance  Health Maintenance Due  Topic Date Due   Colonoscopy  Never done   Zoster Vaccines- Shingrix (1 of 2) Never done   DEXA SCAN  Never done   DTaP/Tdap/Td (2 - Td  or Tdap) 02/16/2021   Medicare Annual Wellness (AWV)  09/17/2021   INFLUENZA VACCINE  12/17/2022   COVID-19 Vaccine (5 - 2023-24 season) 01/17/2023    Colorectal  cancer screening: Type of screening: Colonoscopy. Completed 2018. Repeat every 5 years  Mammogram status: Completed 08/2022. Repeat every year  Bone Density status: Ordered   Lung Cancer Screening: (Low Dose CT Chest recommended if Age 50-80 years, 20 pack-year currently smoking OR have quit w/in 15years.) does not qualify.    Additional Screening:  Vision Screening: Recommended annual ophthalmology exams for early detection of glaucoma and other disorders of the eye. Is the patient up to date with their annual eye exam?  Yes  Who is the provider or what is the name of the office in which the patient attends annual eye exams? [facility]  Dental Screening: Recommended annual dental exams for proper oral hygiene   Community Resource Referral / Chronic Care Management: CRR required this visit?  No   CCM required this visit?  No     Plan:    1- Referral made to [facility] requesting Colonoscopy.  Last Colonoscopy was done at [facility], Dr [address] 5 years and was + for polyps.  Records requested. 2[facility] 3- Patient plans to get flu vaccine in a couple of months.  She will get the tetanus and shingles vaccine at the pharmacy.  I have personally reviewed and noted the following in the patient's chart:   Medical and social history Use of alcohol, tobacco or illicit drugs  Current medications and supplements including opioid prescriptions. Patient is not currently taking opioid prescriptions. Functional ability and status Nutritional status Physical activity Advanced directives List of other physicians Hospitalizations, surgeries, and ER visits in previous 12 months Vitals Screenings to include cognitive, depression, and falls Referrals and appointments  In addition, I have reviewed and discussed with patient certain preventive protocols, quality metrics, and best practice recommendations. A written personalized care plan for preventive services  as well as general preventive health recommendations were provided to patient.     Jacklynn Bue, LPN   4/0/9811   After Visit Summary: (MyChart) Due to this being a telephonic visit, the after visit summary with patients personalized plan was offered to patient via MyChart

## 2023-01-20 NOTE — Patient Instructions (Signed)
Melinda Miller , Thank you for taking time to come for your Medicare Wellness Visit. I appreciate your ongoing commitment to your health goals. Please review the following plan we discussed and let me know if I can assist you in the future.    This is a list of the screening recommended for you and due dates:  Health Maintenance  Topic Date Due   Colon Cancer Screening  Ordered   Zoster (Shingles) Vaccine (1 of 2) Never done   DEXA scan (bone density measurement)  Ordered   DTaP/Tdap/Td vaccine (2 - Td or Tdap) 02/16/2021   COVID-19 Vaccine (5 - 2023-24 season) 01/17/2023   Flu Shot  03/22/2023*   Mammogram  09/08/2023   Medicare Annual Wellness Visit  01/20/2024   Pneumonia Vaccine  Completed   HPV Vaccine  Aged Out   Hepatitis C Screening  Discontinued  *Topic was postponed. The date shown is not the original due date.    Preventive Care 70 Years and Older, Female Preventive care refers to lifestyle choices and visits with your health care provider that can promote health and wellness. What does preventive care include? A yearly physical exam. This is also called an annual well check. Dental exams once or twice a year. Routine eye exams. Ask your health care provider how often you should have your eyes checked. Personal lifestyle choices, including: Daily care of your teeth and gums. Regular physical activity. Eating a healthy diet. Avoiding tobacco and drug use. Limiting alcohol use. Practicing safe sex. Taking low-dose aspirin every day. Taking vitamin and mineral supplements as recommended by your health care provider. What happens during an annual well check? The services and screenings done by your health care provider during your annual well check will depend on your age, overall health, lifestyle risk factors, and family history of disease. Counseling  Your health care provider may ask you questions about your: Alcohol use. Tobacco use. Drug use. Emotional  well-being. Home and relationship well-being. Sexual activity. Eating habits. History of falls. Memory and ability to understand (cognition). Work and work Astronomer. Reproductive health. Screening  You may have the following tests or measurements: Height, weight, and BMI. Blood pressure. Lipid and cholesterol levels. These may be checked every 5 years, or more frequently if you are over 42 years old. Skin check. Lung cancer screening. You may have this screening every year starting at age 52 if you have a 30-pack-year history of smoking and currently smoke or have quit within the past 15 years. Fecal occult blood test (FOBT) of the stool. You may have this test every year starting at age 37. Flexible sigmoidoscopy or colonoscopy. You may have a sigmoidoscopy every 5 years or a colonoscopy every 10 years starting at age 24. Hepatitis C blood test. Hepatitis B blood test. Sexually transmitted disease (STD) testing. Diabetes screening. This is done by checking your blood sugar (glucose) after you have not eaten for a while (fasting). You may have this done every 1-3 years. Bone density scan. This is done to screen for osteoporosis. You may have this done starting at age 24. Mammogram. This may be done every 1-2 years. Talk to your health care provider about how often you should have regular mammograms. Talk with your health care provider about your test results, treatment options, and if necessary, the need for more tests. Vaccines  Your health care provider may recommend certain vaccines, such as: Influenza vaccine. This is recommended every year. Tetanus, diphtheria, and acellular pertussis (Tdap, Td)  vaccine. You may need a Td booster every 10 years. Zoster vaccine. You may need this after age 45. Pneumococcal 13-valent conjugate (PCV13) vaccine. One dose is recommended after age 2. Pneumococcal polysaccharide (PPSV23) vaccine. One dose is recommended after age 14. Talk to your  health care provider about which screenings and vaccines you need and how often you need them. This information is not intended to replace advice given to you by your health care provider. Make sure you discuss any questions you have with your health care provider. Document Released: 05/31/2015 Document Revised: 01/22/2016 Document Reviewed: 03/05/2015 Elsevier Interactive Patient Education  2017 [employer].  Fall Prevention in the Home Falls can cause injuries. They can happen to people of all ages. There are many things you can do to make your home safe and to help prevent falls. What can I do on the outside of my home? Regularly fix the edges of walkways and driveways and fix any cracks. Remove anything that might make you trip as you walk through a door, such as a raised step or threshold. Trim any bushes or trees on the path to your home. Use bright outdoor lighting. Clear any walking paths of anything that might make someone trip, such as rocks or tools. Regularly check to see if handrails are loose or broken. Make sure that both sides of any steps have handrails. Any raised decks and porches should have guardrails on the edges. Have any leaves, snow, or ice cleared regularly. Use sand or salt on walking paths during winter. Clean up any spills in your garage right away. This includes oil or grease spills. What can I do in the bathroom? Use night lights. Install grab bars by the toilet and in the tub and shower. Do not use towel bars as grab bars. Use non-skid mats or decals in the tub or shower. If you need to sit down in the shower, use a plastic, non-slip stool. Keep the floor dry. Clean up any water that spills on the floor as soon as it happens. Remove soap buildup in the tub or shower regularly. Attach bath mats securely with double-sided non-slip rug tape. Do not have throw rugs and other things on the floor that can make you trip. What can I do in the bedroom? Use night  lights. Make sure that you have a light by your bed that is easy to reach. Do not use any sheets or blankets that are too big for your bed. They should not hang down onto the floor. Have a firm chair that has side arms. You can use this for support while you get dressed. Do not have throw rugs and other things on the floor that can make you trip. What can I do in the kitchen? Clean up any spills right away. Avoid walking on wet floors. Keep items that you use a lot in easy-to-reach places. If you need to reach something above you, use a strong step stool that has a grab bar. Keep electrical cords out of the way. Do not use floor polish or wax that makes floors slippery. If you must use wax, use non-skid floor wax. Do not have throw rugs and other things on the floor that can make you trip. What can I do with my stairs? Do not leave any items on the stairs. Make sure that there are handrails on both sides of the stairs and use them. Fix handrails that are broken or loose. Make sure that handrails are as long  as the stairways. Check any carpeting to make sure that it is firmly attached to the stairs. Fix any carpet that is loose or worn. Avoid having throw rugs at the top or bottom of the stairs. If you do have throw rugs, attach them to the floor with carpet tape. Make sure that you have a light switch at the top of the stairs and the bottom of the stairs. If you do not have them, ask someone to add them for you. What else can I do to help prevent falls? Wear shoes that: Do not have high heels. Have rubber bottoms. Are comfortable and fit you well. Are closed at the toe. Do not wear sandals. If you use a stepladder: Make sure that it is fully opened. Do not climb a closed stepladder. Make sure that both sides of the stepladder are locked into place. Ask someone to hold it for you, if possible. Clearly mark and make sure that you can see: Any grab bars or handrails. First and last  steps. Where the edge of each step is. Use tools that help you move around (mobility aids) if they are needed. These include: Canes. Walkers. Scooters. Crutches. Turn on the lights when you go into a dark area. Replace any light bulbs as soon as they burn out. Set up your furniture so you have a clear path. Avoid moving your furniture around. If any of your floors are uneven, fix them. If there are any pets around you, be aware of where they are. Review your medicines with your doctor. Some medicines can make you feel dizzy. This can increase your chance of falling. Ask your doctor what other things that you can do to help prevent falls. This information is not intended to replace advice given to you by your health care provider. Make sure you discuss any questions you have with your health care provider. Document Released: 02/28/2009 Document Revised: 10/10/2015 Document Reviewed: 06/08/2014 Elsevier Interactive Patient Education  2017 [employer].

## 2023-02-28 DIAGNOSIS — L2989 Other pruritus: Secondary | ICD-10-CM | POA: Diagnosis not present

## 2023-02-28 DIAGNOSIS — L259 Unspecified contact dermatitis, unspecified cause: Secondary | ICD-10-CM | POA: Diagnosis not present

## 2023-03-01 DIAGNOSIS — L853 Xerosis cutis: Secondary | ICD-10-CM | POA: Diagnosis not present

## 2023-03-01 DIAGNOSIS — L237 Allergic contact dermatitis due to plants, except food: Secondary | ICD-10-CM | POA: Diagnosis not present

## 2023-03-01 DIAGNOSIS — L570 Actinic keratosis: Secondary | ICD-10-CM | POA: Diagnosis not present

## 2023-03-08 DIAGNOSIS — L570 Actinic keratosis: Secondary | ICD-10-CM | POA: Diagnosis not present

## 2023-03-08 DIAGNOSIS — L814 Other melanin hyperpigmentation: Secondary | ICD-10-CM | POA: Diagnosis not present

## 2023-03-08 DIAGNOSIS — D225 Melanocytic nevi of trunk: Secondary | ICD-10-CM | POA: Diagnosis not present

## 2023-03-08 DIAGNOSIS — L821 Other seborrheic keratosis: Secondary | ICD-10-CM | POA: Diagnosis not present

## 2023-03-24 NOTE — Assessment & Plan Note (Addendum)
Well controlled now Taking Pantoprazole 40 mg Taking Famotidine 40 mg OD

## 2023-03-24 NOTE — Assessment & Plan Note (Signed)
Well controlled.  No changes to medicines. Rosuvastatin 10 mg daily. Continue to work on eating a healthy diet and exercise.  Labs drawn today.   

## 2023-03-24 NOTE — Progress Notes (Signed)
Subjective:  Patient ID: Melinda Miller, female    DOB: 04/24/1951  Age: 72 y.o. MRN: [id number]  Chief Complaint  Patient presents with   Medical Management of Chronic Issues    HPI Melinda Miller is here for a regular follow up and she has hypertension, GERD, and mixed hyperlipidemia.   For Hypertension: Currently on Telmisartan 40 mg  She reports good compliance with treatment. She is following a Regular diet. She is exercising. Swim two mornings a week, and also walking 15-20 mins. She does not smoke.  For hyperlipidemia:     Duration of hyperlipidemia: chronic Cholesterol medication side effects: some muscle cramps so taking with COQ10 cholesterol medications: Crestor 10 mg      Medication compliance: excellent compliance      Aspirin: no  GERD: Protonix  40 mg and Pepcid 40 mg daily.  Muscle cramps at night. Starting magnesium which helped. Taking magnesium 3 pills before bed.      01/20/2023    9:26 AM 12/14/2022    7:59 AM 07/28/2021    3:46 PM 11/13/2020    9:42 AM 09/17/2020    9:28 AM  Depression screen PHQ 2/9  Decreased Interest 0 0 0 0 0  Down, Depressed, Hopeless 0 0 0 0 0  PHQ - 2 Score 0 0 0 0 0        01/20/2023    9:03 AM  Fall Risk   Falls in the past year? 0  Number falls in past yr: 0  Injury with Fall? 0  Risk for fall due to : No Fall Risks  Follow up Falls evaluation completed;Education provided    Patient Care Team: Blane Ohara, MD as PCP - General (Family Medicine)   Review of Systems  Constitutional:  Negative for chills, fatigue and fever.  HENT:  Negative for congestion, ear pain, rhinorrhea and sore throat.   Respiratory:  Negative for cough and shortness of breath.   Cardiovascular:  Negative for chest pain.  Gastrointestinal:  Negative for abdominal pain, constipation, diarrhea, nausea and vomiting.  Genitourinary:  Negative for dysuria and urgency.  Musculoskeletal:  Negative for back pain and myalgias.  Neurological:  Negative for  dizziness, weakness, light-headedness and headaches.  Psychiatric/Behavioral:  Negative for dysphoric mood. The patient is not nervous/anxious.     Current Outpatient Medications on File Prior to Visit  Medication Sig Dispense Refill   Calcium Carbonate-Vit D-Min (CALCIUM 1200 PO) Take 1,200 mg by mouth in the morning and at bedtime.     diclofenac Sodium (VOLTAREN) 1 % GEL Apply 4 g topically 4 (four) times daily. 350 g 2   No current facility-administered medications on file prior to visit.   Past Medical History:  Diagnosis Date   Essential hypertension    GERD (gastroesophageal reflux disease)    Mixed hyperlipidemia    Past Surgical History:  Procedure Laterality Date   ABDOMINAL HYSTERECTOMY     JOINT REPLACEMENT Right 2023   Hip    Family History  Problem Relation Age of Onset   Alzheimer's disease Mother    CAD Father    Heart attack Father    Cancer Sister        kidney   Alzheimer's disease Sister    Social History   Socioeconomic History   Marital status: Married    Spouse name: Jilliane Camano   Number of children: 2   Years of education: Not on file   Highest education level: Not on file  Occupational History   Occupation: Runner, broadcasting/film/video (Retired)  Tobacco Use   Smoking status: Never   Smokeless tobacco: Never  Vaping Use   Vaping status: Never Used  Substance and Sexual Activity   Alcohol use: Yes    Alcohol/week: 1.0 standard drink of alcohol    Types: 1 Cans of beer per week   Drug use: Never   Sexual activity: Not on file  Other Topics Concern   Not on file  Social History Narrative   Not on file   Social Determinants of Health   Financial Resource Strain: Low Risk  (12/14/2022)   Overall Financial Resource Strain (CARDIA)    Difficulty of Paying Living Expenses: Not hard at all  Food Insecurity: No Food Insecurity (12/14/2022)   Hunger Vital Sign    Worried About Running Out of Food in the Last Year: Never true    Ran Out of Food in the Last  Year: Never true  Transportation Needs: No Transportation Needs (12/14/2022)   PRAPARE - Administrator, Civil Service (Medical): No    Lack of Transportation (Non-Medical): No  Physical Activity: Sufficiently Active (12/14/2022)   Exercise Vital Sign    Days of Exercise per Week: 4 days    Minutes of Exercise per Session: 60 min  Stress: No Stress Concern Present (12/14/2022)   [employer] of Occupational Health - Occupational Stress Questionnaire    Feeling of Stress : Not at all  Social Connections: Moderately Integrated (12/14/2022)   Social Connection and Isolation Panel [NHANES]    Frequency of Communication with Friends and Family: More than three times a week    Frequency of Social Gatherings with Friends and Family: More than three times a week    Attends Religious Services: More than 4 times per year    Active Member of [employer] or Organizations: No    Attends Engineer, structural: Never    Marital Status: Married    Objective:  BP 136/68   Pulse 72   Temp (!) 97.2 F (36.2 C)   Ht 5\' 4"  (1.626 m)   Wt 149 lb (67.6 kg)   SpO2 97%   BMI 25.58 kg/m      03/25/2023    9:17 AM 01/20/2023    9:01 AM 12/14/2022    8:00 AM  BP/Weight  Systolic BP 136 118 110  Diastolic BP 68 62 60  Wt. (Lbs) 149 141.2 141  BMI 25.58 kg/m2 24.24 kg/m2 24.2 kg/m2    Physical Exam Vitals reviewed.  Constitutional:      Appearance: Normal appearance. She is normal weight.  Neck:     Vascular: No carotid bruit.  Cardiovascular:     Rate and Rhythm: Normal rate and regular rhythm.     Heart sounds: Normal heart sounds.  Pulmonary:     Effort: Pulmonary effort is normal. No respiratory distress.     Breath sounds: Normal breath sounds.  Abdominal:     General: Abdomen is flat. Bowel sounds are normal.     Palpations: Abdomen is soft.     Tenderness: There is no abdominal tenderness.  Neurological:     Mental Status: She is alert and oriented to person, place,  and time.  Psychiatric:        Mood and Affect: Mood normal.        Behavior: Behavior normal.     Diabetic Foot Exam - Simple   No data filed      Lab Results  Component Value Date   WBC 6.5 03/25/2023   HGB 13.1 03/25/2023   HCT 39.0 03/25/2023   PLT 232 03/25/2023   GLUCOSE 98 03/25/2023   CHOL 171 03/25/2023   TRIG 72 03/25/2023   HDL 65 03/25/2023   LDLCALC 92 03/25/2023   ALT 14 03/25/2023   AST 22 03/25/2023   NA 137 03/25/2023   K 5.3 (H) 03/25/2023   CL 99 03/25/2023   CREATININE 1.12 (H) 03/25/2023   BUN 23 03/25/2023   CO2 23 03/25/2023   TSH 2.340 03/25/2023   INR 1.0 10/27/2021   HGBA1C 6.2 (H) 05/26/2022      Assessment & Plan:    Essential hypertension, benign Assessment & Plan: Well controlled Patient is taking Telmisartan 40 mg daily     Orders: -     CBC with Differential/Platelet -     Comprehensive metabolic panel -     Telmisartan; Take 1 tablet (40 mg total) by mouth daily.  Dispense: 90 tablet; Refill: 0  GERD without esophagitis Assessment & Plan: Well controlled now Taking Pantoprazole 40 mg Taking Famotidine 40 mg OD   Orders: -     Famotidine; Take 1 tablet (40 mg total) by mouth at bedtime.  Dispense: 90 tablet; Refill: 0 -     Pantoprazole Sodium; Take 1 tablet (40 mg total) by mouth daily.  Dispense: 90 tablet; Refill: 0  Mixed hyperlipidemia Assessment & Plan: Well controlled.  No changes to medicines. Rosuvastatin 10 mg daily. Continue to work on eating a healthy diet and exercise.  Labs drawn today.    Orders: -     Lipid panel -     Rosuvastatin Calcium; Take 1 tablet (10 mg total) by mouth daily.  Dispense: 90 tablet; Refill: 0  Rash, skin Assessment & Plan: Sent Diprolene-AF 0.05% cream  Orders: -     Betamethasone Dipropionate Aug; APPLY TO AFFECTED AREA TWICE A DAY  Dispense: 50 g; Refill: 1  Myalgia Assessment & Plan: Check labs  Orders: -     Phosphorus -     Magnesium -      TSH  Encounter for immunization -     Flu Vaccine Trivalent High Dose (Fluad)     Meds ordered this encounter  Medications   famotidine (PEPCID) 40 MG tablet    Sig: Take 1 tablet (40 mg total) by mouth at bedtime.    Dispense:  90 tablet    Refill:  0   pantoprazole (PROTONIX) 40 MG tablet    Sig: Take 1 tablet (40 mg total) by mouth daily.    Dispense:  90 tablet    Refill:  0    This prescription was filled on 11/24/2021. Any refills authorized will be placed on file.   rosuvastatin (CRESTOR) 10 MG tablet    Sig: Take 1 tablet (10 mg total) by mouth daily.    Dispense:  90 tablet    Refill:  0   telmisartan (MICARDIS) 40 MG tablet    Sig: Take 1 tablet (40 mg total) by mouth daily.    Dispense:  90 tablet    Refill:  0    Please call to set up your annual wellness visit. Thank you, Dr. Sedalia Muta   augmented betamethasone dipropionate (DIPROLENE-AF) 0.05 % cream    Sig: APPLY TO AFFECTED AREA TWICE A DAY    Dispense:  50 g    Refill:  1    Orders Placed This Encounter  Procedures  Flu Vaccine Trivalent High Dose (Fluad)   CBC with Differential/Platelet   Comprehensive metabolic panel   Lipid panel   Phosphorus   Magnesium   TSH     Follow-up: Return in about 6 months (around 09/22/2023) for chronic follow up.   I,Dajiah Kooi I Leal-Borjas,acting as a scribe for Blane Ohara, MD.,have documented all relevant documentation on the behalf of Blane Ohara, MD,as directed by  Blane Ohara, MD while in the presence of Blane Ohara, MD.   An After Visit Summary was printed and given to the patient.  Blane Ohara, MD [facility] [phone]

## 2023-03-24 NOTE — Assessment & Plan Note (Signed)
Well controlled Patient is taking Telmisartan 40 mg daily

## 2023-03-25 ENCOUNTER — Ambulatory Visit: Payer: Medicare PPO | Admitting: Family Medicine

## 2023-03-25 ENCOUNTER — Encounter: Payer: Self-pay | Admitting: Family Medicine

## 2023-03-25 ENCOUNTER — Other Ambulatory Visit: Payer: Self-pay

## 2023-03-25 VITALS — BP 136/68 | HR 72 | Temp 97.2°F | Ht 64.0 in | Wt 149.0 lb

## 2023-03-25 DIAGNOSIS — I10 Essential (primary) hypertension: Secondary | ICD-10-CM

## 2023-03-25 DIAGNOSIS — E782 Mixed hyperlipidemia: Secondary | ICD-10-CM | POA: Diagnosis not present

## 2023-03-25 DIAGNOSIS — K219 Gastro-esophageal reflux disease without esophagitis: Secondary | ICD-10-CM

## 2023-03-25 DIAGNOSIS — M791 Myalgia, unspecified site: Secondary | ICD-10-CM

## 2023-03-25 DIAGNOSIS — Z23 Encounter for immunization: Secondary | ICD-10-CM | POA: Diagnosis not present

## 2023-03-25 DIAGNOSIS — R21 Rash and other nonspecific skin eruption: Secondary | ICD-10-CM

## 2023-03-25 MED ORDER — TELMISARTAN 40 MG PO TABS: 40.0000 mg | ORAL_TABLET | Freq: Every day | ORAL | 0 refills | Status: DC

## 2023-03-25 MED ORDER — FAMOTIDINE 40 MG PO TABS
40.0000 mg | ORAL_TABLET | Freq: Every day | ORAL | 0 refills | Status: DC
Start: 2023-03-25 — End: 2023-08-18

## 2023-03-25 MED ORDER — PANTOPRAZOLE SODIUM 40 MG PO TBEC: 40.0000 mg | DELAYED_RELEASE_TABLET | Freq: Every day | ORAL | 0 refills | Status: DC

## 2023-03-25 MED ORDER — BETAMETHASONE DIPROPIONATE AUG 0.05 % EX CREA: TOPICAL_CREAM | CUTANEOUS | 1 refills | Status: DC

## 2023-03-25 MED ORDER — ROSUVASTATIN CALCIUM 10 MG PO TABS: 10.0000 mg | ORAL_TABLET | Freq: Every day | ORAL | 0 refills | Status: DC

## 2023-03-26 LAB — COMPREHENSIVE METABOLIC PANEL
ALT: 14 [IU]/L (ref 0–32)
AST: 22 [IU]/L (ref 0–40)
Albumin: 4.5 g/dL (ref 3.9–4.9)
Alkaline Phosphatase: 103 [IU]/L (ref 44–121)
BUN/Creatinine Ratio: 21 (ref 12–28)
BUN: 23 mg/dL (ref 8–27)
Bilirubin Total: 0.7 mg/dL (ref 0.0–1.2)
CO2: 23 mmol/L (ref 20–29)
Calcium: 9.9 mg/dL (ref 8.7–10.3)
Chloride: 99 mmol/L (ref 96–106)
Creatinine, Ser: 1.12 mg/dL — ABNORMAL HIGH (ref 0.57–1.00)
Globulin, Total: 3 g/dL (ref 1.5–4.5)
Glucose: 98 mg/dL (ref 70–99)
Potassium: 5.3 mmol/L — ABNORMAL HIGH (ref 3.5–5.2)
Sodium: 137 mmol/L (ref 134–144)
Total Protein: 7.5 g/dL (ref 6.0–8.5)
eGFR: 53 mL/min/{1.73_m2} — ABNORMAL LOW (ref >59)

## 2023-03-26 LAB — CBC WITH DIFFERENTIAL/PLATELET
Basophils Absolute: 0.1 10*3/uL (ref 0.0–0.2)
Basos: 1 %
EOS (ABSOLUTE): 0.1 10*3/uL (ref 0.0–0.4)
Eos: 1 %
Hematocrit: 39 % (ref 34.0–46.6)
Hemoglobin: 13.1 g/dL (ref 11.1–15.9)
Immature Grans (Abs): 0 10*3/uL (ref 0.0–0.1)
Immature Granulocytes: 0 %
Lymphocytes Absolute: 2.2 10*3/uL (ref 0.7–3.1)
Lymphs: 34 %
MCH: 33.3 pg — ABNORMAL HIGH (ref 26.6–33.0)
MCHC: 33.6 g/dL (ref 31.5–35.7)
MCV: 99 fL — ABNORMAL HIGH (ref 79–97)
Monocytes Absolute: 0.9 10*3/uL (ref 0.1–0.9)
Monocytes: 13 %
Neutrophils Absolute: 3.3 10*3/uL (ref 1.4–7.0)
Neutrophils: 51 %
Platelets: 232 10*3/uL (ref 150–450)
RBC: 3.93 x10E6/uL (ref 3.77–5.28)
RDW: 13.2 % (ref 11.7–15.4)
WBC: 6.5 10*3/uL (ref 3.4–10.8)

## 2023-03-26 LAB — LIPID PANEL
Chol/HDL Ratio: 2.6 ratio (ref 0.0–4.4)
Cholesterol, Total: 171 mg/dL (ref 100–199)
HDL: 65 mg/dL (ref >39)
LDL Chol Calc (NIH): 92 mg/dL (ref 0–99)
Triglycerides: 72 mg/dL (ref 0–149)
VLDL Cholesterol Cal: 14 mg/dL (ref 5–40)

## 2023-03-26 LAB — MAGNESIUM: Magnesium: 2.3 mg/dL (ref 1.6–2.3)

## 2023-03-26 LAB — PHOSPHORUS: Phosphorus: 4.1 mg/dL (ref 3.0–4.3)

## 2023-03-26 LAB — TSH: TSH: 2.34 u[IU]/mL (ref 0.450–4.500)

## 2023-03-28 DIAGNOSIS — M791 Myalgia, unspecified site: Secondary | ICD-10-CM | POA: Insufficient documentation

## 2023-03-28 NOTE — Assessment & Plan Note (Signed)
Sent Diprolene-AF 0.05% cream

## 2023-03-28 NOTE — Assessment & Plan Note (Signed)
Check labs 

## 2023-03-31 DIAGNOSIS — L299 Pruritus, unspecified: Secondary | ICD-10-CM | POA: Diagnosis not present

## 2023-03-31 DIAGNOSIS — L237 Allergic contact dermatitis due to plants, except food: Secondary | ICD-10-CM | POA: Diagnosis not present

## 2023-06-10 ENCOUNTER — Other Ambulatory Visit: Payer: Self-pay | Admitting: Family Medicine

## 2023-06-10 DIAGNOSIS — R21 Rash and other nonspecific skin eruption: Secondary | ICD-10-CM

## 2023-07-29 DIAGNOSIS — R233 Spontaneous ecchymoses: Secondary | ICD-10-CM | POA: Diagnosis not present

## 2023-07-29 DIAGNOSIS — L821 Other seborrheic keratosis: Secondary | ICD-10-CM | POA: Diagnosis not present

## 2023-08-18 ENCOUNTER — Other Ambulatory Visit: Payer: Self-pay | Admitting: Family Medicine

## 2023-08-18 DIAGNOSIS — K219 Gastro-esophageal reflux disease without esophagitis: Secondary | ICD-10-CM

## 2023-08-18 DIAGNOSIS — E782 Mixed hyperlipidemia: Secondary | ICD-10-CM

## 2023-08-18 DIAGNOSIS — I10 Essential (primary) hypertension: Secondary | ICD-10-CM

## 2023-08-19 MED ORDER — ROSUVASTATIN CALCIUM 10 MG PO TABS: 10.0000 mg | ORAL_TABLET | Freq: Every day | ORAL | 0 refills | Status: DC

## 2023-08-19 MED ORDER — FAMOTIDINE 40 MG PO TABS: 40.0000 mg | ORAL_TABLET | Freq: Every day | ORAL | 0 refills | Status: DC

## 2023-08-19 MED ORDER — PANTOPRAZOLE SODIUM 40 MG PO TBEC: 40.0000 mg | DELAYED_RELEASE_TABLET | Freq: Every day | ORAL | 0 refills | Status: DC

## 2023-08-19 MED ORDER — TELMISARTAN 40 MG PO TABS
40.0000 mg | ORAL_TABLET | Freq: Every day | ORAL | 0 refills | Status: DC
Start: 2023-08-19 — End: 2023-11-17

## 2023-09-14 DIAGNOSIS — Z1231 Encounter for screening mammogram for malignant neoplasm of breast: Secondary | ICD-10-CM | POA: Diagnosis not present

## 2023-09-14 LAB — HM MAMMOGRAPHY

## 2023-09-17 ENCOUNTER — Encounter: Payer: Self-pay | Admitting: Family Medicine

## 2023-09-22 NOTE — Progress Notes (Signed)
 Subjective:  Patient ID: Melinda Miller, female    DOB: [birth year]  Age: 73 y.o. MRN: [id number]  Chief Complaint  Patient presents with   Medical Management of Chronic Issues   HPI:  Melinda Miller is here for a regular follow up and she has hypertension, GERD, and mixed hyperlipidemia.    For Hypertension: Currently on Telmisartan  40 mg  She reports good compliance with treatment. She is following low fat diet. Eats vegetables, fruits. Limits carbs.  She is exercising. Walking 15-20 mins and 5 times a week.  She does not smoke. At home 130's and 60-70's.   For hyperlipidemia:     Duration of hyperlipidemia: chronic Cholesterol medication side effects: some muscle cramps so taking with COQ10 cholesterol medications: Crestor  10 mg      Medication compliance: excellent compliance      Aspirin: no   GERD: Protonix   40 mg and Pepcid  40 mg daily.   Muscle cramps at night. Starting magnesium which helped. Taking magnesium 3 pills before bed.      09/27/2023    9:47 PM 01/20/2023    9:26 AM 12/14/2022    7:59 AM 07/28/2021    3:46 PM 11/13/2020    9:42 AM  Depression screen PHQ 2/9  Decreased Interest 0 0 0 0 0  Down, Depressed, Hopeless 0 0 0 0 0  PHQ - 2 Score 0 0 0 0 0        09/27/2023    9:47 PM  Fall Risk   Falls in the past year? 0  Number falls in past yr: 0  Injury with Fall? 0  Risk for fall due to : No Fall Risks  Follow up Falls evaluation completed    Patient Care Team: Mercy Stall, MD as PCP - General (Family Medicine)   Review of Systems  Constitutional:  Negative for chills, fatigue and fever.  HENT:  Negative for congestion, ear pain, rhinorrhea and sore throat.   Respiratory:  Negative for cough and shortness of breath.   Cardiovascular:  Negative for chest pain.  Gastrointestinal:  Negative for abdominal pain, constipation, diarrhea, nausea and vomiting.  Genitourinary:  Negative for dysuria and urgency.  Musculoskeletal:  Negative for back pain and  myalgias.  Neurological:  Negative for dizziness, weakness, light-headedness and headaches.  Psychiatric/Behavioral:  Negative for dysphoric mood. The patient is not nervous/anxious.     Current Outpatient Medications on File Prior to Visit  Medication Sig Dispense Refill   augmented betamethasone  dipropionate (DIPROLENE -AF) 0.05 % cream APPLY TO AFFECTED AREA TWICE A DAY 50 g 1   Calcium  Carbonate-Vit D-Min (CALCIUM  1200 PO) Take 1,200 mg by mouth in the morning and at bedtime.     clobetasol cream (TEMOVATE) 0.05 % Apply topically 2 (two) times daily.     famotidine  (PEPCID ) 40 MG tablet Take 1 tablet (40 mg total) by mouth at bedtime. 90 tablet 0   magnesium gluconate (MAGONATE) 500 (27 Mg) MG TABS tablet Take 500 mg by mouth daily.     pantoprazole  (PROTONIX ) 40 MG tablet Take 1 tablet (40 mg total) by mouth daily. 90 tablet 0   rosuvastatin  (CRESTOR ) 10 MG tablet Take 1 tablet (10 mg total) by mouth daily. 90 tablet 0   telmisartan  (MICARDIS ) 40 MG tablet Take 1 tablet (40 mg total) by mouth daily. 90 tablet 0   No current facility-administered medications on file prior to visit.   Past Medical History:  Diagnosis Date   Essential hypertension  GERD (gastroesophageal reflux disease)    Mixed hyperlipidemia    Past Surgical History:  Procedure Laterality Date   ABDOMINAL HYSTERECTOMY     JOINT REPLACEMENT Right 2023   Hip    Family History  Problem Relation Age of Onset   Alzheimer's disease Mother    CAD Father    Heart attack Father    Cancer Sister        kidney   Alzheimer's disease Sister    Social History   Socioeconomic History   Marital status: Married    Spouse name: Shawndee Tejero   Number of children: 2   Years of education: Not on file   Highest education level: Not on file  Occupational History   Occupation: Runner, broadcasting/film/video (Retired)  Tobacco Use   Smoking status: Never   Smokeless tobacco: Never  Vaping Use   Vaping status: Never Used  Substance and  Sexual Activity   Alcohol use: Yes    Alcohol/week: 1.0 standard drink of alcohol    Types: 1 Cans of beer per week   Drug use: Never   Sexual activity: Not on file  Other Topics Concern   Not on file  Social History Narrative   Not on file   Social Drivers of Health   Financial Resource Strain: Low Risk  (12/14/2022)   Overall Financial Resource Strain (CARDIA)    Difficulty of Paying Living Expenses: Not hard at all  Food Insecurity: No Food Insecurity (12/14/2022)   Hunger Vital Sign    Worried About Running Out of Food in the Last Year: Never true    Ran Out of Food in the Last Year: Never true  Transportation Needs: No Transportation Needs (12/14/2022)   PRAPARE - Administrator, Civil Service (Medical): No    Lack of Transportation (Non-Medical): No  Physical Activity: Sufficiently Active (12/14/2022)   Exercise Vital Sign    Days of Exercise per Week: 4 days    Minutes of Exercise per Session: 60 min  Stress: No Stress Concern Present (12/14/2022)   [employer] of Occupational Health - Occupational Stress Questionnaire    Feeling of Stress : Not at all  Social Connections: Moderately Integrated (12/14/2022)   Social Connection and Isolation Panel [NHANES]    Frequency of Communication with Friends and Family: More than three times a week    Frequency of Social Gatherings with Friends and Family: More than three times a week    Attends Religious Services: More than 4 times per year    Active Member of [employer] or Organizations: No    Attends Engineer, structural: Never    Marital Status: Married    Objective:  BP (!) 140/60   Pulse 72   Temp (!) 97.4 F (36.3 C)   Resp 14   Ht 5\' 4"  (1.626 m)   Wt 146 lb (66.2 kg)   SpO2 99%   BMI 25.06 kg/m      09/23/2023    8:55 AM 03/25/2023    9:17 AM 01/20/2023    9:01 AM  BP/Weight  Systolic BP 140 136 118  Diastolic BP 60 68 62  Wt. (Lbs) 146 149 141.2  BMI 25.06 kg/m2 25.58 kg/m2 24.24 kg/m2     Physical Exam Vitals reviewed.  Constitutional:      Appearance: Normal appearance. She is normal weight.  Neck:     Vascular: No carotid bruit.  Cardiovascular:     Rate and Rhythm: Normal rate and regular  rhythm.     Heart sounds: Normal heart sounds.  Pulmonary:     Effort: Pulmonary effort is normal. No respiratory distress.     Breath sounds: Normal breath sounds.  Abdominal:     General: Abdomen is flat. Bowel sounds are normal.     Palpations: Abdomen is soft.     Tenderness: There is no abdominal tenderness.  Neurological:     Mental Status: She is alert and oriented to person, place, and time.  Psychiatric:        Mood and Affect: Mood normal.        Behavior: Behavior normal.     Diabetic Foot Exam - Simple   No data filed      Lab Results  Component Value Date   WBC 7.8 09/23/2023   HGB 13.0 09/23/2023   HCT 36.4 09/23/2023   PLT 202 09/23/2023   GLUCOSE 103 (H) 09/23/2023   CHOL 160 09/23/2023   TRIG 73 09/23/2023   HDL 59 09/23/2023   LDLCALC 87 09/23/2023   ALT 13 09/23/2023   AST 21 09/23/2023   NA 138 09/23/2023   K 4.8 09/23/2023   CL 98 09/23/2023   CREATININE 0.91 09/23/2023   BUN 19 09/23/2023   CO2 21 09/23/2023   TSH 1.380 09/23/2023   INR 1.0 10/27/2021   HGBA1C 5.8 (H) 09/23/2023      Assessment & Plan:  Essential hypertension, benign Assessment & Plan: Well controlled Patient is taking Telmisartan  40 mg daily. Labs drawn today.     Orders: -     Comprehensive metabolic panel with GFR -     TSH  GERD without esophagitis Assessment & Plan: Well controlled. Taking Pantoprazole  40 mg Taking Famotidine  40 mg OD    Mixed hyperlipidemia Assessment & Plan: Well controlled.  No changes to medicines. Rosuvastatin  10 mg daily. Continue to work on eating a healthy diet and exercise.  Labs drawn today.    Orders: -     Lipid panel  Prediabetes Assessment & Plan: Hemoglobin A1c 6.2%, 3 month avg of blood sugars,  is in prediabetic range.  In order to prevent progression to diabetes, recommend low carb diet and regular exercise   Orders: -     CBC with Differential/Platelet -     Hemoglobin A1c  Osteoporosis screening -     DG Bone Density; Future  Bilateral impacted cerumen Assessment & Plan: Ear wax looped successfully.      No orders of the defined types were placed in this encounter.   Orders Placed This Encounter  Procedures   DG Bone Density   CBC with Differential/Platelet   Comprehensive metabolic panel with GFR   Lipid panel   Hemoglobin A1c   TSH     Follow-up: Return in about 3 months (around 12/24/2023) for chronic follow up.   I,Katherina A Bramblett,acting as a scribe for Mercy Stall, MD.,have documented all relevant documentation on the behalf of Mercy Stall, MD,as directed by  Mercy Stall, MD while in the presence of Mercy Stall, MD.   Gladys Lamp I Leal-Borjas,acting as a scribe for Mercy Stall, MD.,have documented all relevant documentation on the behalf of Mercy Stall, MD,as directed by  Mercy Stall, MD while in the presence of Mercy Stall, MD.    An After Visit Summary was printed and given to the patient.  Mercy Stall, MD [facility] [phone]

## 2023-09-23 ENCOUNTER — Encounter: Payer: Self-pay | Admitting: Family Medicine

## 2023-09-23 ENCOUNTER — Ambulatory Visit: Payer: Medicare PPO | Admitting: Family Medicine

## 2023-09-23 VITALS — BP 140/60 | HR 72 | Temp 97.4°F | Resp 14 | Ht 64.0 in | Wt 146.0 lb

## 2023-09-23 DIAGNOSIS — H6123 Impacted cerumen, bilateral: Secondary | ICD-10-CM

## 2023-09-23 DIAGNOSIS — E782 Mixed hyperlipidemia: Secondary | ICD-10-CM

## 2023-09-23 DIAGNOSIS — K219 Gastro-esophageal reflux disease without esophagitis: Secondary | ICD-10-CM

## 2023-09-23 DIAGNOSIS — R7303 Prediabetes: Secondary | ICD-10-CM | POA: Diagnosis not present

## 2023-09-23 DIAGNOSIS — Z13820 Encounter for screening for osteoporosis: Secondary | ICD-10-CM | POA: Diagnosis not present

## 2023-09-23 DIAGNOSIS — I10 Essential (primary) hypertension: Secondary | ICD-10-CM

## 2023-09-23 NOTE — Assessment & Plan Note (Signed)
 Well controlled. Taking Pantoprazole  40 mg Taking Famotidine  40 mg OD

## 2023-09-23 NOTE — Assessment & Plan Note (Signed)
Ear wax looped successfully.

## 2023-09-23 NOTE — Assessment & Plan Note (Signed)
Hemoglobin A1c 6.2%, 3 month avg of blood sugars, is in prediabetic range.  In order to prevent progression to diabetes, recommend low carb diet and regular exercise  

## 2023-09-23 NOTE — Assessment & Plan Note (Signed)
 Well controlled Patient is taking Telmisartan  40 mg daily. Labs drawn today.

## 2023-09-23 NOTE — Assessment & Plan Note (Signed)
Well controlled.  No changes to medicines. Rosuvastatin 10 mg daily. Continue to work on eating a healthy diet and exercise.  Labs drawn today.   

## 2023-09-24 ENCOUNTER — Encounter: Payer: Self-pay | Admitting: Family Medicine

## 2023-09-24 LAB — LIPID PANEL
Chol/HDL Ratio: 2.7 ratio (ref 0.0–4.4)
Cholesterol, Total: 160 mg/dL (ref 100–199)
HDL: 59 mg/dL (ref >39)
LDL Chol Calc (NIH): 87 mg/dL (ref 0–99)
Triglycerides: 73 mg/dL (ref 0–149)
VLDL Cholesterol Cal: 14 mg/dL (ref 5–40)

## 2023-09-24 LAB — COMPREHENSIVE METABOLIC PANEL WITH GFR
ALT: 13 IU/L (ref 0–32)
AST: 21 IU/L (ref 0–40)
Albumin: 4.4 g/dL (ref 3.9–4.9)
Alkaline Phosphatase: 89 IU/L (ref 44–121)
BUN/Creatinine Ratio: 21 (ref 12–28)
BUN: 19 mg/dL (ref 8–27)
Bilirubin Total: 0.4 mg/dL (ref 0.0–1.2)
CO2: 21 mmol/L (ref 20–29)
Calcium: 9.4 mg/dL (ref 8.7–10.3)
Chloride: 98 mmol/L (ref 96–106)
Creatinine, Ser: 0.91 mg/dL (ref 0.57–1.00)
Globulin, Total: 2.5 g/dL (ref 1.5–4.5)
Glucose: 103 mg/dL — ABNORMAL HIGH (ref 70–99)
Potassium: 4.8 mmol/L (ref 3.5–5.2)
Sodium: 138 mmol/L (ref 134–144)
Total Protein: 6.9 g/dL (ref 6.0–8.5)
eGFR: 68 mL/min/{1.73_m2} (ref >59)

## 2023-09-24 LAB — CBC WITH DIFFERENTIAL/PLATELET
Basophils Absolute: 0.1 10*3/uL (ref 0.0–0.2)
Basos: 1 %
EOS (ABSOLUTE): 0.1 10*3/uL (ref 0.0–0.4)
Eos: 1 %
Hematocrit: 36.4 % (ref 34.0–46.6)
Hemoglobin: 13 g/dL (ref 11.1–15.9)
Immature Grans (Abs): 0 10*3/uL (ref 0.0–0.1)
Immature Granulocytes: 0 %
Lymphocytes Absolute: 2.3 10*3/uL (ref 0.7–3.1)
Lymphs: 30 %
MCH: 34.4 pg — ABNORMAL HIGH (ref 26.6–33.0)
MCHC: 35.7 g/dL (ref 31.5–35.7)
MCV: 96 fL (ref 79–97)
Monocytes Absolute: 0.9 10*3/uL (ref 0.1–0.9)
Monocytes: 12 %
Neutrophils Absolute: 4.4 10*3/uL (ref 1.4–7.0)
Neutrophils: 56 %
Platelets: 202 10*3/uL (ref 150–450)
RBC: 3.78 x10E6/uL (ref 3.77–5.28)
RDW: 12.6 % (ref 11.7–15.4)
WBC: 7.8 10*3/uL (ref 3.4–10.8)

## 2023-09-24 LAB — HEMOGLOBIN A1C
Est. average glucose Bld gHb Est-mCnc: 120 mg/dL
Hgb A1c MFr Bld: 5.8 % — ABNORMAL HIGH (ref 4.8–5.6)

## 2023-09-24 LAB — TSH: TSH: 1.38 u[IU]/mL (ref 0.450–4.500)

## 2023-09-28 ENCOUNTER — Other Ambulatory Visit (HOSPITAL_BASED_OUTPATIENT_CLINIC_OR_DEPARTMENT_OTHER): Admitting: Radiology

## 2023-09-29 ENCOUNTER — Ambulatory Visit (INDEPENDENT_AMBULATORY_CARE_PROVIDER_SITE_OTHER)
Admission: RE | Admit: 2023-09-29 | Discharge: 2023-09-29 | Disposition: A | Discharge status: Home / Self Care | Source: Ambulatory Visit | Attending: Family Medicine | Admitting: Family Medicine

## 2023-09-29 DIAGNOSIS — M810 Age-related osteoporosis without current pathological fracture: Secondary | ICD-10-CM

## 2023-09-29 DIAGNOSIS — Z780 Asymptomatic menopausal state: Secondary | ICD-10-CM | POA: Diagnosis not present

## 2023-09-29 DIAGNOSIS — Z13820 Encounter for screening for osteoporosis: Secondary | ICD-10-CM | POA: Diagnosis not present

## 2023-09-30 ENCOUNTER — Ambulatory Visit

## 2023-09-30 ENCOUNTER — Ambulatory Visit: Payer: Self-pay | Admitting: Family Medicine

## 2023-09-30 VITALS — BP 130/70

## 2023-09-30 DIAGNOSIS — I10 Essential (primary) hypertension: Secondary | ICD-10-CM

## 2023-09-30 NOTE — Progress Notes (Addendum)
 Patient is in office today for a nurse visit for Blood Pressure Check. Patient blood pressure was 130/70, Patient No chest pain, No shortness of breath, No dyspnea on exertion, No orthopnea, No paroxysmal nocturnal dyspnea, No edema, No palpitations, No syncope, Patient stated she takes her Telmistartn at night  Patient did bring record of Blood Pressure:  09/23/23: 116/70 1:20 PM 09/24/23: 113/60 8:05 AM; 112/60 2:20 PM 09/26/23: 112/65 8:25 AM; 112/65 1 PM 09/27/23: 106/65 9 AM 09/28/23: 114/63 9 AM; 112/65 4:30 PM 09/29/23: 113/62 8 AM; 110/73 5 PM; 112/76 11 PM 09/30/23: 104/64 AM, 131/72 @ Appointment with home machine

## 2023-10-07 ENCOUNTER — Other Ambulatory Visit: Payer: Self-pay

## 2023-10-07 DIAGNOSIS — M810 Age-related osteoporosis without current pathological fracture: Secondary | ICD-10-CM

## 2023-10-07 IMAGING — DX DG CHEST 2V
2 series · 2 of 2 positions shown · non-contrast
Comparison: 06/22/2019

CLINICAL DATA: Preoperative evaluation for hip surgery

EXAM:
CHEST - 2 VIEW

[chest pa]
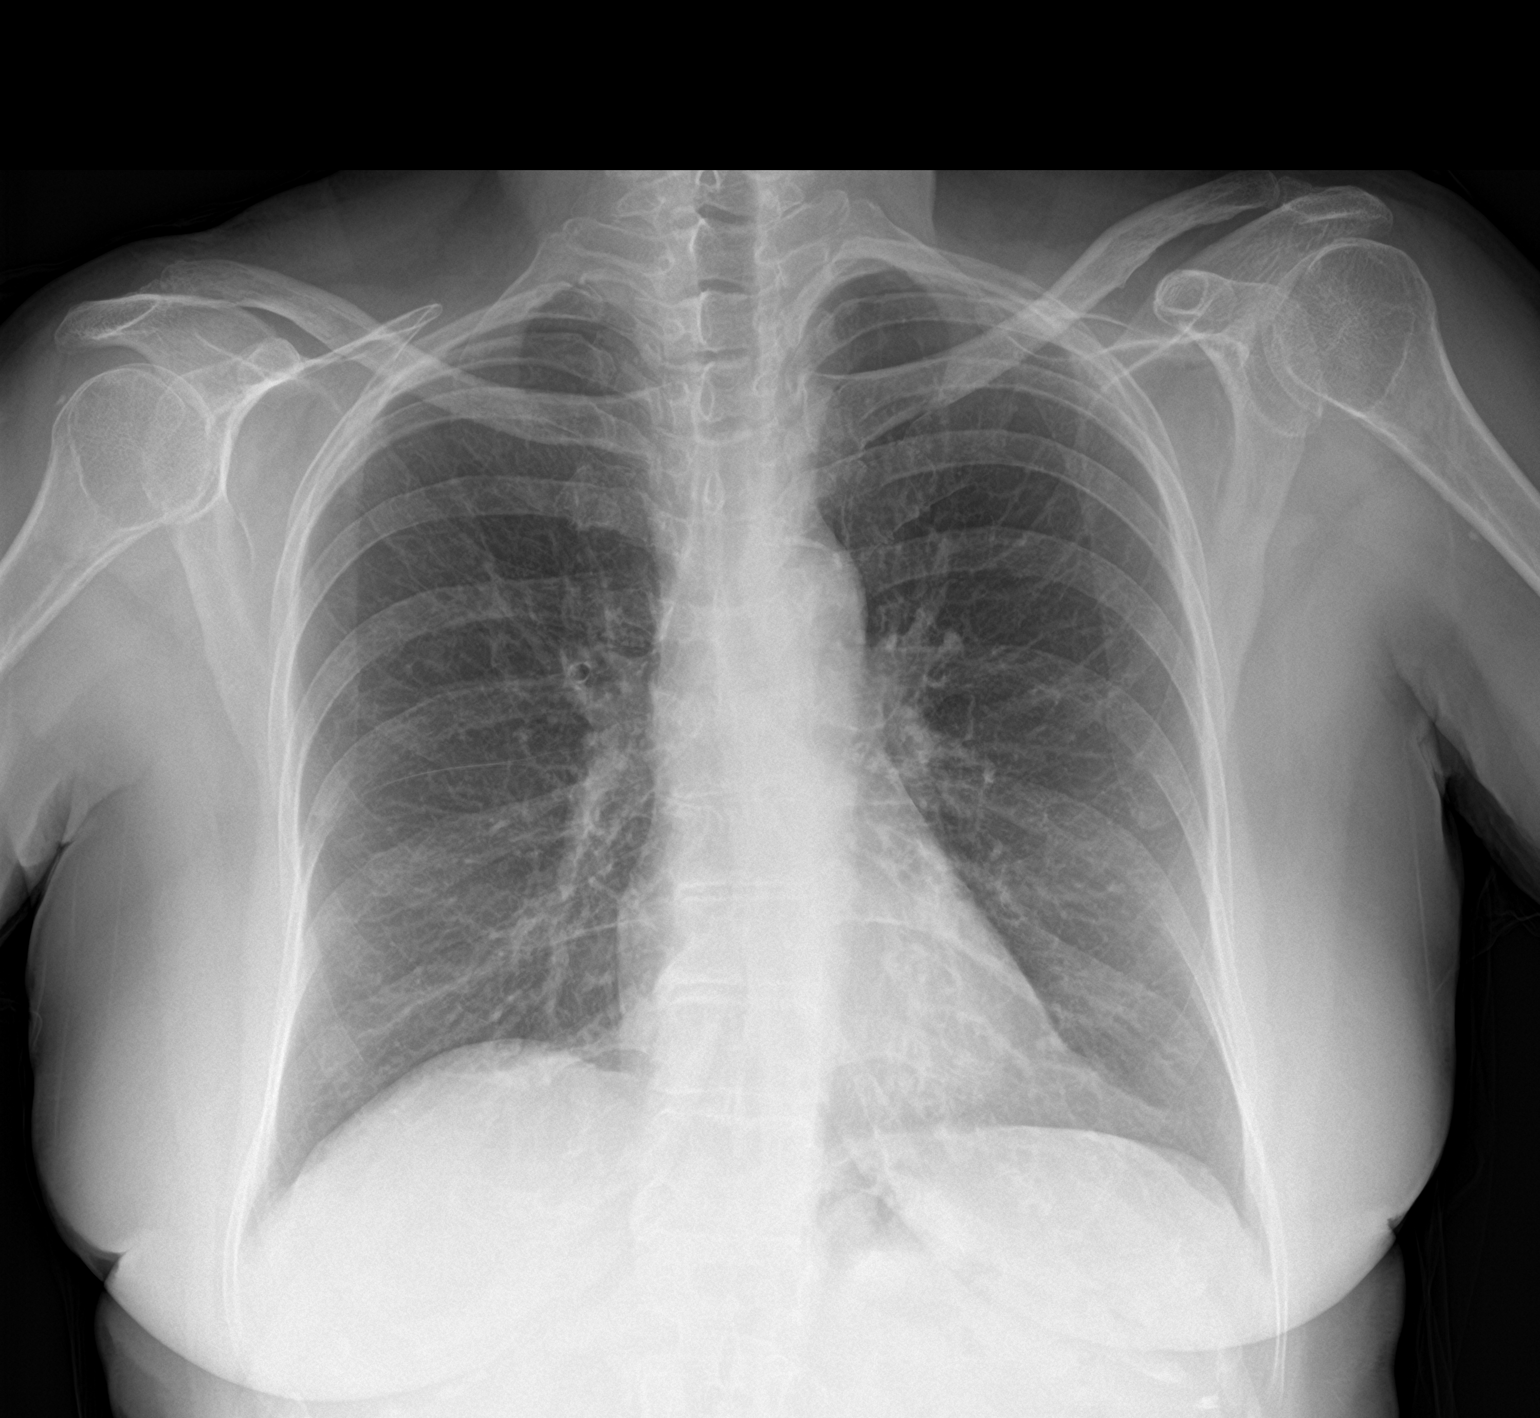

[chest lat]
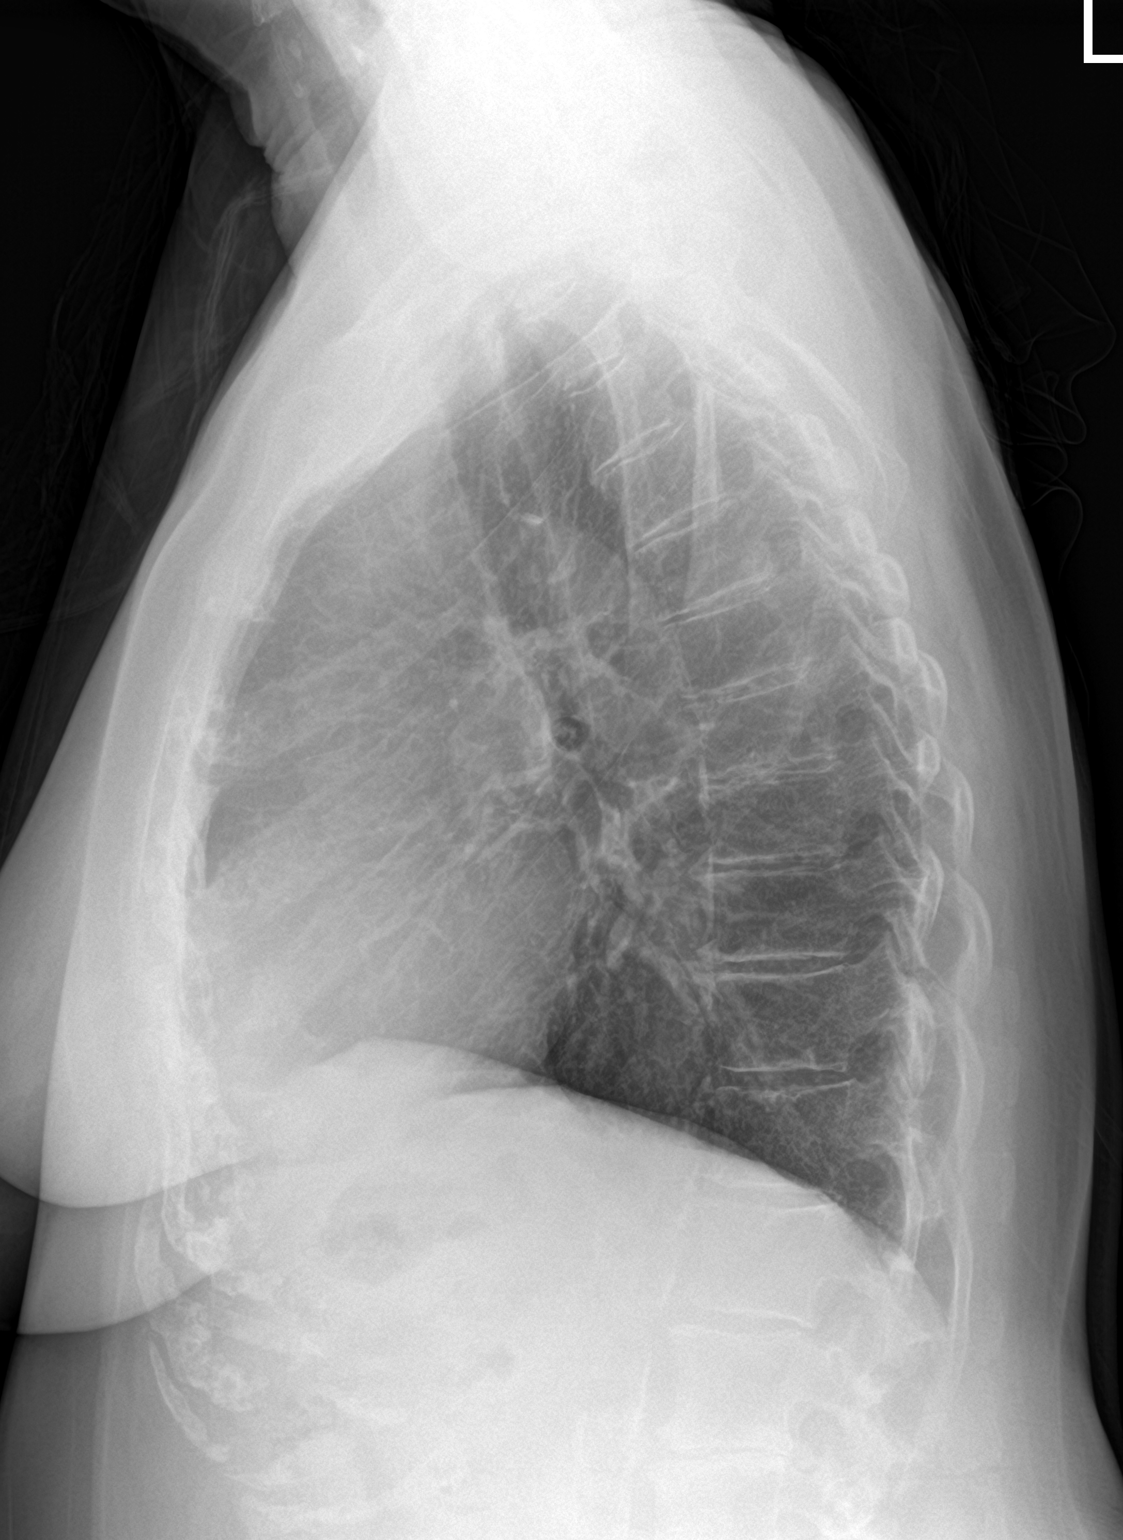

[2 of 2 positions shown; findings below may reference images not displayed]

FINDINGS: The heart size and mediastinal contours are within normal limits.
Both lungs are clear. The visualized skeletal structures are
unremarkable. Trachea midline. Aorta atherosclerotic.
IMPRESSION: No active cardiopulmonary disease.

## 2023-10-07 MED ORDER — DENOSUMAB 60 MG/ML ~~LOC~~ SOSY
60.0000 mg | PREFILLED_SYRINGE | Freq: Once | SUBCUTANEOUS | Status: AC
  Administered 2023-10-21: 60 mg via SUBCUTANEOUS

## 2023-10-07 MED ORDER — DENOSUMAB 60 MG/ML ~~LOC~~ SOSY: 60.0000 mg | PREFILLED_SYRINGE | Freq: Once | SUBCUTANEOUS | 0 refills | Status: AC

## 2023-10-08 DIAGNOSIS — H5203 Hypermetropia, bilateral: Secondary | ICD-10-CM | POA: Diagnosis not present

## 2023-10-21 ENCOUNTER — Ambulatory Visit (INDEPENDENT_AMBULATORY_CARE_PROVIDER_SITE_OTHER)

## 2023-10-21 DIAGNOSIS — M810 Age-related osteoporosis without current pathological fracture: Secondary | ICD-10-CM

## 2023-10-21 MED ORDER — DENOSUMAB 60 MG/ML ~~LOC~~ SOSY: 60.0000 mg | PREFILLED_SYRINGE | Freq: Once | SUBCUTANEOUS | Status: AC

## 2023-10-21 NOTE — Progress Notes (Signed)
   Patient: [name]  DOB: 1951-04-08  MRN: [id number]    Visit Date: 10/21/2023    Melinda Miller presents today for her six month Prolia  injection.  1 x 60 mg Single-Dose Prefilled Syringe was given SQ in the left arm.  Patient tolerated the injection well and has no questions.  The next Prolia  injection will be due in six months.      Coti Burd A Nardos Putnam, CMA

## 2023-11-17 ENCOUNTER — Other Ambulatory Visit: Payer: Self-pay | Admitting: Family Medicine

## 2023-11-17 DIAGNOSIS — I10 Essential (primary) hypertension: Secondary | ICD-10-CM

## 2023-11-17 DIAGNOSIS — K219 Gastro-esophageal reflux disease without esophagitis: Secondary | ICD-10-CM

## 2023-12-22 ENCOUNTER — Other Ambulatory Visit: Payer: Self-pay

## 2023-12-22 DIAGNOSIS — E782 Mixed hyperlipidemia: Secondary | ICD-10-CM

## 2023-12-22 MED ORDER — ROSUVASTATIN CALCIUM 10 MG PO TABS: 10.0000 mg | ORAL_TABLET | Freq: Every day | ORAL | 0 refills | Status: DC

## 2023-12-28 ENCOUNTER — Ambulatory Visit: Admitting: Family Medicine

## 2023-12-28 ENCOUNTER — Encounter: Payer: Self-pay | Admitting: Family Medicine

## 2023-12-28 VITALS — BP 122/68 | HR 78 | Temp 97.8°F | Resp 16 | Ht 64.0 in | Wt 139.0 lb

## 2023-12-28 DIAGNOSIS — R7303 Prediabetes: Secondary | ICD-10-CM

## 2023-12-28 DIAGNOSIS — E782 Mixed hyperlipidemia: Secondary | ICD-10-CM | POA: Diagnosis not present

## 2023-12-28 DIAGNOSIS — I10 Essential (primary) hypertension: Secondary | ICD-10-CM | POA: Diagnosis not present

## 2023-12-28 DIAGNOSIS — M810 Age-related osteoporosis without current pathological fracture: Secondary | ICD-10-CM

## 2023-12-28 DIAGNOSIS — K219 Gastro-esophageal reflux disease without esophagitis: Secondary | ICD-10-CM | POA: Diagnosis not present

## 2023-12-28 LAB — POCT GLYCOSYLATED HEMOGLOBIN (HGB A1C)
HbA1c POC (<> result, manual entry): 5.7 % (ref 4.0–5.6)
HbA1c, POC (controlled diabetic range): 5.7 % (ref 0.0–7.0)
HbA1c, POC (prediabetic range): 5.7 % (ref 5.7–6.4)
Hemoglobin A1C: 5.7 % — AB (ref 4.0–5.6)

## 2023-12-28 NOTE — Patient Instructions (Signed)
 VISIT SUMMARY:  Today, we reviewed your medications and overall health. Your blood pressure and cholesterol are well-controlled with your current medications. We discussed your osteoporosis treatment, prediabetes, and gastroesophageal reflux disease (GERD). You are maintaining a healthy diet and exercise routine, which is great for your overall health.  YOUR PLAN:  HYPERTENSION: Your blood pressure is well-controlled with your current medication. -Continue taking Micardis  40 mg once daily.  HYPERLIPIDEMIA: Your cholesterol levels are within the acceptable range with your current medication. -Continue taking rosuvastatin  10 mg daily.  PREDIABETES: Your hemoglobin A1c is 5.8%, indicating prediabetes. -We will check your hemoglobin A1c with a new in-office machine.  OSTEOPOROSIS: Your osteoporosis treatment is on track. -Continue with your Prolia  injection as scheduled on December 8th. -Continue your current calcium  supplement regimen.  GASTROESOPHAGEAL REFLUX DISEASE (GERD): Your GERD is managed with famotidine  and pantoprazole , but symptoms return if you stop taking pantoprazole . -Continue taking famotidine  as prescribed. -Continue taking pantoprazole  40 mg once daily.  GENERAL HEALTH MAINTENANCE: You are maintaining a healthy diet and exercise routine. -Continue taking a multivitamin. -Keep up with your healthy eating and regular exercise.

## 2023-12-28 NOTE — Progress Notes (Signed)
 Subjective:  Patient ID: Melinda Miller, female    DOB: [birth year]  Age: 73 y.o. MRN: [id number]  Chief Complaint  Patient presents with   Medical Management of Chronic Issues    51M   Discussed the use of AI scribe software for clinical note transcription with the patient, who gave verbal consent to proceed.  History of Present Illness   Melinda Miller is a 73 year old female who presents for medication review and follow-up.  Osteoporosis management - Receives Prolia  for osteoporosis - Next Prolia  appointment scheduled for December 8th - Takes an allergy-based calcium  supplement, one tablet in the morning and two at night, totaling 3000 mg daily, due to belief in better absorption - Not taking additional magnesium due to its presence in her calcium  supplement  Hypertension management - Takes Micardis  40 mg daily for blood pressure control  Hyperlipidemia management - Takes rosuvastatin  10 mg daily for cholesterol management - Last blood work in May showed normal cholesterol levels  Gastroesophageal reflux symptoms - Takes famotidine  and pantoprazole  40 mg daily for heartburn - Heartburn recurs if pantoprazole  is discontinued  General constitutional and systemic symptoms - No fevers, chills, sweats, earaches, sore throat, stuffy nose, chest pain, abdominal pain, breathing problems, bowel or bladder issues, nausea, or vomiting - No depression or sadness  Diet and weight management - Maintains a healthy diet - Lost seven pounds due to dietary changes and regular walking  Recent laboratory findings - Last blood work in May showed normal liver and kidney function, cholesterol, and blood count - Hemoglobin A1c was 5.8 - Thyroid  function was normal         12/28/2023    8:27 AM 09/27/2023    9:47 PM 01/20/2023    9:26 AM 12/14/2022    7:59 AM 07/28/2021    3:46 PM  Depression screen PHQ 2/9  Decreased Interest 0 0 0 0 0  Down, Depressed, Hopeless 0 0 0 0 0  PHQ - 2 Score  0 0 0 0 0        12/28/2023    8:29 AM  Fall Risk   Falls in the past year? 0  Number falls in past yr: 0  Injury with Fall? 0  Risk for fall due to : No Fall Risks  Follow up Falls evaluation completed    Patient Care Team: Sherre Clapper, MD as PCP - General (Family Medicine)   Review of Systems  Constitutional:  Negative for chills, fatigue and fever.  HENT:  Negative for congestion, ear pain and sore throat.   Respiratory:  Negative for cough and shortness of breath.   Cardiovascular:  Negative for chest pain.  Gastrointestinal:  Negative for abdominal pain, constipation, diarrhea, nausea and vomiting.  Genitourinary:  Negative for dysuria and urgency.  Musculoskeletal:  Negative for arthralgias and myalgias.  Skin:  Negative for rash.  Neurological:  Negative for dizziness and headaches.  Psychiatric/Behavioral:  Negative for dysphoric mood. The patient is not nervous/anxious.     Current Outpatient Medications on File Prior to Visit  Medication Sig Dispense Refill   augmented betamethasone  dipropionate (DIPROLENE -AF) 0.05 % cream APPLY TO AFFECTED AREA TWICE A DAY 50 g 1   clobetasol cream (TEMOVATE) 0.05 % Apply topically 2 (two) times daily.     famotidine  (PEPCID ) 40 MG tablet Take 1 tablet (40 mg total) by mouth at bedtime. 90 tablet 0   Multiple Vitamins-Minerals (ALGAE BASED CALCIUM ) TABS Take 3 tablets by mouth daily.  pantoprazole  (PROTONIX ) 40 MG tablet Take 1 tablet (40 mg total) by mouth daily. 90 tablet 0   rosuvastatin  (CRESTOR ) 10 MG tablet Take 1 tablet (10 mg total) by mouth daily. 90 tablet 0   telmisartan  (MICARDIS ) 40 MG tablet Take 1 tablet (40 mg total) by mouth daily. 90 tablet 0   Current Facility-Administered Medications on File Prior to Visit  Medication Dose Route Frequency Provider Last Rate Last Admin   [START ON 04/18/2024] denosumab  (PROLIA ) injection 60 mg  60 mg Subcutaneous Once Melinda Miller, Abigail, MD       Past Medical History:  Diagnosis  Date   Essential hypertension    GERD (gastroesophageal reflux disease)    Mixed hyperlipidemia    Past Surgical History:  Procedure Laterality Date   ABDOMINAL HYSTERECTOMY     JOINT REPLACEMENT Right 2023   Hip    Family History  Problem Relation Age of Onset   Alzheimer's disease Mother    CAD Father    Heart attack Father    Cancer Sister        kidney   Alzheimer's disease Sister    Social History   Socioeconomic History   Marital status: Married    Spouse name: Melinda Miller   Number of children: 2   Years of education: Not on file   Highest education level: Not on file  Occupational History   Occupation: Runner, broadcasting/film/video (Retired)  Tobacco Use   Smoking status: Never   Smokeless tobacco: Never  Vaping Use   Vaping status: Never Used  Substance and Sexual Activity   Alcohol use: Yes    Alcohol/week: 1.0 standard drink of alcohol    Types: 1 Cans of beer per week   Drug use: Never   Sexual activity: Not on file  Other Topics Concern   Not on file  Social History Narrative   Not on file   Social Drivers of Health   Miller Resource Strain: Low Risk  (12/28/2023)   Overall Miller Resource Strain (CARDIA)    Difficulty of Paying Living Expenses: Not hard at all  Food Insecurity: No Food Insecurity (12/28/2023)   Hunger Vital Sign    Worried About Running Out of Food in the Last Year: Never true    Ran Out of Food in the Last Year: Never true  Transportation Needs: No Transportation Needs (12/28/2023)   PRAPARE - Administrator, Civil Service (Medical): No    Lack of Transportation (Non-Medical): No  Physical Activity: Sufficiently Active (12/28/2023)   Exercise Vital Sign    Days of Exercise per Week: 4 days    Minutes of Exercise per Session: 60 min  Stress: No Stress Concern Present (12/28/2023)   Melinda Miller    Feeling of Stress: Not at all  Social Connections: Moderately  Integrated (12/28/2023)   Social Connection and Isolation Panel    Frequency of Communication with Friends and Family: More than three times a week    Frequency of Social Gatherings with Friends and Family: More than three times a week    Attends Religious Services: More than 4 times per year    Active Member of Melinda Miller or Organizations: No    Attends Engineer, structural: Never    Marital Status: Married    Objective:  BP 122/68   Pulse 78   Temp 97.8 F (36.6 C)   Resp 16   Ht 5' 4 (1.626 m)   Wt  139 lb (63 kg)   SpO2 97%   BMI 23.86 kg/m      12/28/2023    8:18 AM 09/30/2023    8:46 AM 09/23/2023    8:55 AM  BP/Weight  Systolic BP 122 130 140  Diastolic BP 68 70 60  Wt. (Lbs) 139  146  BMI 23.86 kg/m2  25.06 kg/m2    Physical Exam Vitals reviewed.  Constitutional:      Appearance: Normal appearance. She is normal weight.  Neck:     Vascular: No carotid bruit.  Cardiovascular:     Rate and Rhythm: Normal rate and regular rhythm.     Heart sounds: Normal heart sounds.  Pulmonary:     Effort: Pulmonary effort is normal. No respiratory distress.     Breath sounds: Normal breath sounds.  Abdominal:     General: Abdomen is flat. Bowel sounds are normal.     Palpations: Abdomen is soft.     Tenderness: There is no abdominal tenderness.  Neurological:     Mental Status: She is alert and oriented to person, place, and time.  Psychiatric:        Mood and Affect: Mood normal.        Behavior: Behavior normal.     {Perform Simple Foot Exam  Perform Detailed exam:1} {Insert foot Exam (Optional):30965}   Lab Results  Component Value Date   WBC 7.8 09/23/2023   HGB 13.0 09/23/2023   HCT 36.4 09/23/2023   PLT 202 09/23/2023   GLUCOSE 103 (H) 09/23/2023   CHOL 160 09/23/2023   TRIG 73 09/23/2023   HDL 59 09/23/2023   LDLCALC 87 09/23/2023   ALT 13 09/23/2023   AST 21 09/23/2023   NA 138 09/23/2023   K 4.8 09/23/2023   CL 98 09/23/2023   CREATININE  0.91 09/23/2023   BUN 19 09/23/2023   CO2 21 09/23/2023   TSH 1.380 09/23/2023   INR 1.0 10/27/2021   HGBA1C 5.8 (H) 09/23/2023      Assessment & Plan:  There are no diagnoses linked to this encounter.   No orders of the defined types were placed in this encounter.   No orders of the defined types were placed in this encounter.   Assessment and Plan    Hypertension Hypertension well-controlled with current medication. BP 122/68 mmHg. - Continue Micardis  40 mg once daily.  Hyperlipidemia Cholesterol levels within acceptable range with rosuvastatin . - Continue rosuvastatin  10 mg daily.  Prediabetes A1c 5.8%. - Check hemoglobin A1c with new in-office machine.  Osteoporosis Scheduling issue resolved. - Continue Prolia  as scheduled on December 8th. - Continue current calcium  supplement regimen.  Gastroesophageal reflux disease GERD managed with famotidine  and pantoprazole . Recurrence on reduction indicates need for ongoing treatment. - Continue famotidine  as prescribed. - Continue pantoprazole  40 mg once daily.  General Health Maintenance Multivitamin recommended due to dietary gaps. - Continue multivitamin supplementation. - Encourage continued healthy eating and exercise.  Recording duration: 7 minutes       Follow-up: No follow-ups on file.   LILLETTE Suzen HERO Smith,acting as a Neurosurgeon for Abigail Free, MD.,have documented all relevant documentation on the behalf of Abigail Free, MD,as directed by  Abigail Free, MD while in the presence of Abigail Free, MD.   An After Visit Summary was printed and given to the patient.  Abigail Free, MD [facility] [phone]

## 2023-12-29 ENCOUNTER — Encounter: Payer: Self-pay | Admitting: Family Medicine

## 2023-12-29 DIAGNOSIS — M810 Age-related osteoporosis without current pathological fracture: Secondary | ICD-10-CM | POA: Insufficient documentation

## 2023-12-29 NOTE — Assessment & Plan Note (Signed)
 Well controlled Patient is taking Telmisartan  40 mg daily. Labs reviewed

## 2023-12-29 NOTE — Assessment & Plan Note (Signed)
 Well controlled. Taking Pantoprazole  40 mg Taking Famotidine  40 mg OD

## 2023-12-29 NOTE — Assessment & Plan Note (Signed)
 Continue prolia 

## 2023-12-29 NOTE — Assessment & Plan Note (Signed)
Well controlled.  No changes to medicines. Rosuvastatin 10 mg daily. Continue to work on eating a healthy diet and exercise.  Labs drawn today.   

## 2023-12-29 NOTE — Assessment & Plan Note (Signed)
 Hemoglobin A1c 5.7%, Improved.

## 2024-01-13 ENCOUNTER — Other Ambulatory Visit: Payer: Self-pay | Admitting: Family Medicine

## 2024-01-13 DIAGNOSIS — K219 Gastro-esophageal reflux disease without esophagitis: Secondary | ICD-10-CM

## 2024-02-21 ENCOUNTER — Other Ambulatory Visit: Payer: Self-pay | Admitting: Family Medicine

## 2024-02-21 DIAGNOSIS — I10 Essential (primary) hypertension: Secondary | ICD-10-CM

## 2024-02-21 DIAGNOSIS — K219 Gastro-esophageal reflux disease without esophagitis: Secondary | ICD-10-CM

## 2024-03-08 DIAGNOSIS — L820 Inflamed seborrheic keratosis: Secondary | ICD-10-CM | POA: Diagnosis not present

## 2024-03-08 DIAGNOSIS — L578 Other skin changes due to chronic exposure to nonionizing radiation: Secondary | ICD-10-CM | POA: Diagnosis not present

## 2024-03-08 DIAGNOSIS — D225 Melanocytic nevi of trunk: Secondary | ICD-10-CM | POA: Diagnosis not present

## 2024-03-08 DIAGNOSIS — R233 Spontaneous ecchymoses: Secondary | ICD-10-CM | POA: Diagnosis not present

## 2024-03-08 DIAGNOSIS — L814 Other melanin hyperpigmentation: Secondary | ICD-10-CM | POA: Diagnosis not present

## 2024-03-08 DIAGNOSIS — L570 Actinic keratosis: Secondary | ICD-10-CM | POA: Diagnosis not present

## 2024-03-21 ENCOUNTER — Other Ambulatory Visit: Payer: Self-pay | Admitting: Family Medicine

## 2024-03-21 DIAGNOSIS — E782 Mixed hyperlipidemia: Secondary | ICD-10-CM

## 2024-03-29 ENCOUNTER — Ambulatory Visit (INDEPENDENT_AMBULATORY_CARE_PROVIDER_SITE_OTHER)

## 2024-03-29 DIAGNOSIS — Z23 Encounter for immunization: Secondary | ICD-10-CM

## 2024-03-29 NOTE — Progress Notes (Signed)
 Patient is in office today for a nurse visit for Immunization. Patient Injection was given in the  Left deltoid. Patient tolerated injection well.

## 2024-04-07 ENCOUNTER — Other Ambulatory Visit: Payer: Self-pay

## 2024-04-07 DIAGNOSIS — M810 Age-related osteoporosis without current pathological fracture: Secondary | ICD-10-CM

## 2024-04-07 MED ORDER — DENOSUMAB 60 MG/ML ~~LOC~~ SOSY
60.0000 mg | PREFILLED_SYRINGE | SUBCUTANEOUS | Status: AC
  Administered 2024-04-25: 60 mg via SUBCUTANEOUS

## 2024-04-07 MED ORDER — DENOSUMAB 60 MG/ML ~~LOC~~ SOSY: 60.0000 mg | PREFILLED_SYRINGE | Freq: Once | SUBCUTANEOUS | 0 refills | Status: AC

## 2024-04-24 ENCOUNTER — Ambulatory Visit

## 2024-04-25 ENCOUNTER — Ambulatory Visit

## 2024-04-25 DIAGNOSIS — M810 Age-related osteoporosis without current pathological fracture: Secondary | ICD-10-CM | POA: Diagnosis not present

## 2024-04-25 NOTE — Progress Notes (Signed)
 Patient is in office today for a nurse visit for Prolia  Injection. Patient Injection was given in the  Right arm. Patient tolerated injection well.  Prolia  Injection NDC#55513-710-21 ONU#[id number] EXP. Date: 08/16/2026

## 2024-04-26 ENCOUNTER — Other Ambulatory Visit: Payer: Self-pay | Admitting: Family Medicine

## 2024-04-26 DIAGNOSIS — R21 Rash and other nonspecific skin eruption: Secondary | ICD-10-CM

## 2024-05-09 ENCOUNTER — Encounter: Payer: Self-pay | Admitting: Family Medicine

## 2024-05-27 ENCOUNTER — Other Ambulatory Visit: Payer: Self-pay | Admitting: Family Medicine

## 2024-05-27 DIAGNOSIS — I10 Essential (primary) hypertension: Secondary | ICD-10-CM

## 2024-05-27 DIAGNOSIS — K219 Gastro-esophageal reflux disease without esophagitis: Secondary | ICD-10-CM

## 2024-05-30 ENCOUNTER — Ambulatory Visit

## 2024-05-30 VITALS — BP 132/70 | HR 76 | Temp 97.9°F | Ht 64.0 in | Wt 133.6 lb

## 2024-05-30 DIAGNOSIS — Z0000 Encounter for general adult medical examination without abnormal findings: Secondary | ICD-10-CM

## 2024-05-30 NOTE — Patient Instructions (Signed)
 Melinda Miller,  Thank you for taking the time for your Medicare Wellness Visit. I appreciate your continued commitment to your health goals. Please review the care plan we discussed, and feel free to reach out if I can assist you further.  Please note that Annual Wellness Visits do not include a physical exam. Some assessments may be limited, especially if the visit was conducted virtually. If needed, we may recommend an in-person follow-up with your provider.  Ongoing Care Seeing your primary care provider every 3 to 6 months helps us  monitor your health and provide consistent, personalized care.   Referrals If a referral was made during today's visit and you haven't received any updates within two weeks, please contact the referred provider directly to check on the status.  Recommended Screenings:  Health Maintenance  Topic Date Due   Zoster (Shingles) Vaccine (1 of 2) Never done   COVID-19 Vaccine (6 - 2025-26 season) 01/17/2024   Medicare Annual Wellness Visit  01/20/2024   Breast Cancer Screening  09/13/2024   Colon Cancer Screening  12/05/2026   DTaP/Tdap/Td vaccine (6 - Td or Tdap) 07/12/2033   Pneumococcal Vaccine for age over 28  Completed   Flu Shot  Completed   Osteoporosis screening with Bone Density Scan  Completed   Meningitis B Vaccine  Aged Out   Hepatitis C Screening  Discontinued       05/29/2024   10:24 PM  Advanced Directives  Does Patient Have a Medical Advance Directive? Yes  Type of Estate Agent of [place];Living will;Out of facility DNR (pink MOST or yellow form)  Does patient want to make changes to medical advance directive? No - Patient declined  Copy of Healthcare Power of Attorney in Chart? No - copy requested    Vision: Annual vision screenings are recommended for early detection of glaucoma, cataracts, and diabetic retinopathy. These exams can also reveal signs of chronic conditions such as diabetes and high blood  pressure.  Dental: Annual dental screenings help detect early signs of oral cancer, gum disease, and other conditions linked to overall health, including heart disease and diabetes.  Please see the attached documents for additional preventive care recommendations.

## 2024-05-30 NOTE — Progress Notes (Signed)
 " I connected with  Melinda Miller on 05/30/2024 by a in office visit    Patient Location: Other:  In office   Provider Location: Office/Clinic  Persons Participating in Visit: Patient.   Chief Complaint  Patient presents with   Medicare Wellness     Subjective:   Melinda Miller is a 74 y.o. female who presents for a Medicare Annual Wellness Visit.  Visit info / Clinical Intake: Medicare Wellness Visit Type:: Subsequent Annual Wellness Visit Persons participating in visit and providing information:: patient Medicare Wellness Visit Mode:: In-person (required for WTM) Interpreter Needed?: No Pre-visit prep was completed: yes AWV questionnaire completed by patient prior to visit?: yes Date:: 05/29/24 Living arrangements:: (Patient-Rptd) lives with spouse/significant other Patient's Overall Health Status Rating: (Patient-Rptd) very good Typical amount of pain: (Patient-Rptd) some Does pain affect daily life?: (Patient-Rptd) no Are you currently prescribed opioids?: no  Dietary Habits and Nutritional Risks How many meals a day?: (Patient-Rptd) 3 Eats fruit and vegetables daily?: (Patient-Rptd) yes Most meals are obtained by: (Patient-Rptd) preparing own meals In the last 2 weeks, have you had any of the following?: none Diabetic:: no  Functional Status Activities of Daily Living (to include ambulation/medication): (Patient-Rptd) Independent Ambulation: (Patient-Rptd) Independent Medication Administration: (Patient-Rptd) Independent Home Management (perform basic housework or laundry): (Patient-Rptd) Independent Manage your own finances?: (Patient-Rptd) yes Primary transportation is: (Patient-Rptd) driving Concerns about vision?: no *vision screening is required for WTM* Concerns about hearing?: no  Fall Screening Falls in the past year?: (Patient-Rptd) 0 Number of falls in past year: 0 Was there an injury with Fall?: 0 Fall Risk Category Calculator: 0 Patient Fall  Risk Level: Low Fall Risk  Fall Risk Patient at Risk for Falls Due to: No Fall Risks Fall risk Follow up: Falls evaluation completed  Home and Transportation Safety: All rugs have non-skid backing?: (Patient-Rptd) yes All stairs or steps have railings?: (Patient-Rptd) N/A, no stairs Grab bars in the bathtub or shower?: (!) (Patient-Rptd) no Have non-skid surface in bathtub or shower?: (Patient-Rptd) yes Good home lighting?: (Patient-Rptd) yes Regular seat belt use?: (Patient-Rptd) yes Hospital stays in the last year:: (Patient-Rptd) no  Cognitive Assessment Difficulty concentrating, remembering, or making decisions? : (Patient-Rptd) no Will 6CIT or Mini Cog be Completed: yes What year is it?: 0 points What month is it?: 0 points Give patient an address phrase to remember (5 components): remember words apple, table ,penny About what time is it?: 0 points Count backwards from 20 to 1: 0 points Say the months of the year in reverse: 0 points Repeat the address phrase from earlier: 0 points 6 CIT Score: 0 points  Advance Directives (For Healthcare) Does Patient Have a Medical Advance Directive?: Yes Does patient want to make changes to medical advance directive?: No - Patient declined Type of Advance Directive: Healthcare Power of [place]; Living will; Out of facility DNR (pink MOST or yellow form) Copy of Healthcare Power of Attorney in Chart?: No - copy requested Copy of Living Will in Chart?: No - copy requested Out of facility DNR (pink MOST or yellow form) in Chart? (Ambulatory ONLY): No - copy requested  Reviewed/Updated  Reviewed/Updated: Reviewed All (Medical, Surgical, Family, Medications, Allergies, Care Teams, Patient Goals)    Allergies (verified) Sulfa antibiotics and Shellfish allergy   Current Medications (verified) Outpatient Encounter Medications as of 05/30/2024  Medication Sig   augmented betamethasone  dipropionate (DIPROLENE -AF) 0.05 % cream APPLY TO  AFFECTED AREA TWICE A DAY   clobetasol cream (TEMOVATE) 0.05 % Apply  topically 2 (two) times daily.   famotidine  (PEPCID ) 40 MG tablet TAKE ONE TABLET BY MOUTH ONCE DAILY AT BEDTIME   Multiple Vitamins-Minerals (ALGAE BASED CALCIUM ) TABS Take 3 tablets by mouth daily.   pantoprazole  (PROTONIX ) 40 MG tablet Take 1 tablet (40 mg total) by mouth daily.   rosuvastatin  (CRESTOR ) 10 MG tablet TAKE ONE TABLET BY MOUTH ONCE DAILY   telmisartan  (MICARDIS ) 40 MG tablet TAKE ONE TABLET BY MOUTH ONCE DAILY   Facility-Administered Encounter Medications as of 05/30/2024  Medication   denosumab  (PROLIA ) injection 60 mg   denosumab  (PROLIA ) injection 60 mg    History: Past Medical History:  Diagnosis Date   Essential hypertension    GERD (gastroesophageal reflux disease)    Mixed hyperlipidemia    Past Surgical History:  Procedure Laterality Date   ABDOMINAL HYSTERECTOMY     JOINT REPLACEMENT Right 2023   Hip   Family History  Problem Relation Age of Onset   Alzheimer's disease Mother    CAD Father    Heart attack Father    Cancer Sister        kidney   Alzheimer's disease Sister    Social History   Occupational History   Occupation: Runner, Broadcasting/film/video (Retired)  Tobacco Use   Smoking status: Never   Smokeless tobacco: Never  Vaping Use   Vaping status: Never Used  Substance and Sexual Activity   Alcohol use: Yes    Alcohol/week: 1.0 standard drink of alcohol    Types: 1 Cans of beer per week   Drug use: Never   Sexual activity: Not on file   Tobacco Counseling Counseling given: Not Answered  SDOH Screenings   Food Insecurity: No Food Insecurity (05/29/2024)  Housing: Low Risk (05/29/2024)  Transportation Needs: No Transportation Needs (05/29/2024)  Utilities: Not At Risk (05/30/2024)  Alcohol Screen: Low Risk (05/29/2024)  Depression (PHQ2-9): Low Risk (05/30/2024)  Financial Resource Strain: Low Risk (05/29/2024)  Physical Activity: Insufficiently Active (05/29/2024)  Social  Connections: Socially Integrated (05/29/2024)  Stress: No Stress Concern Present (05/29/2024)  Tobacco Use: Low Risk (05/30/2024)  Health Literacy: Adequate Health Literacy (05/30/2024)   See flowsheets for full screening details  Depression Screen PHQ 2 & 9 Depression Scale- Over the past 2 weeks, how often have you been bothered by any of the following problems? Little interest or pleasure in doing things: 0 Feeling down, depressed, or hopeless (PHQ Adolescent also includes...irritable): 0 PHQ-2 Total Score: 0 Trouble falling or staying asleep, or sleeping too much: 2 Feeling tired or having little energy: 0 Poor appetite or overeating (PHQ Adolescent also includes...weight loss): 0 Feeling bad about yourself - or that you are a failure or have let yourself or your family down: 0 Trouble concentrating on things, such as reading the newspaper or watching television (PHQ Adolescent also includes...like school work): 0 Moving or speaking so slowly that other people could have noticed. Or the opposite - being so fidgety or restless that you have been moving around a lot more than usual: 0 Thoughts that you would be better off dead, or of hurting yourself in some way: 0 PHQ-9 Total Score: 2 If you checked off any problems, how difficult have these problems made it for you to do your work, take care of things at home, or get along with other people?: Not difficult at all  Depression Treatment Depression Interventions/Treatment : EYV7-0 Score <4 Follow-up Not Indicated     Goals Addressed  This Visit's Progress     Patient Stated (pt-stated)        Patient would like to eat healthier              Objective:    Today's Vitals   05/30/24 0849  BP: 132/70  Pulse: 76  Temp: 97.9 F (36.6 C)  SpO2: 95%  Weight: 133 lb 9.6 oz (60.6 kg)  Height: 5' 4 (1.626 m)   Body mass index is 22.93 kg/m.  Hearing/Vision screen Hearing Screening - Comments:: Wears hearing  aids  Vision Screening - Comments:: Wears glasses . Patient sees Dr. Kernodle at [facility]  Immunizations and Health Maintenance Health Maintenance  Topic Date Due   Zoster Vaccines- Shingrix (1 of 2) Never done   COVID-19 Vaccine (6 - 2025-26 season) 01/17/2024   Medicare Annual Wellness (AWV)  01/20/2024   Mammogram  09/13/2024   Colonoscopy  12/05/2026   DTaP/Tdap/Td (6 - Td or Tdap) 07/12/2033   Pneumococcal Vaccine: 50+ Years  Completed   Influenza Vaccine  Completed   Bone Density Scan  Completed   Meningococcal B Vaccine  Aged Out   Hepatitis C Screening  Discontinued        Assessment/Plan:  This is a routine wellness examination for Ysidra.  Patient Care Team: Sherre Clapper, MD as PCP - General (Family Medicine)  I have personally reviewed and noted the following in the patients chart:   Medical and social history Use of alcohol, tobacco or illicit drugs  Current medications and supplements including opioid prescriptions. Functional ability and status Nutritional status Physical activity Advanced directives List of other physicians Hospitalizations, surgeries, and ER visits in previous 12 months Vitals Screenings to include cognitive, depression, and falls Referrals and appointments  No orders of the defined types were placed in this encounter.  In addition, I have reviewed and discussed with patient certain preventive protocols, quality metrics, and best practice recommendations. A written personalized care plan for preventive services as well as general preventive health recommendations were provided to patient.   Lyle MARLA Right, [place]   05/30/2024   No follow-ups on file.  After Visit Summary: (In Person-Declined) Patient declined AVS at this time. Patient advised to keep follow-up appointment with PCP (07/04/2024) Separate telephone note sent for (Measles ) Vaccines not given: covid and shingles vaccinations declined today  "

## 2024-06-29 ENCOUNTER — Ambulatory Visit: Admitting: Family Medicine

## 2024-06-29 DIAGNOSIS — E782 Mixed hyperlipidemia: Secondary | ICD-10-CM

## 2024-06-29 DIAGNOSIS — R7303 Prediabetes: Secondary | ICD-10-CM
